# Patient Record
Sex: Male | Born: 1945 | Race: Black or African American | Hispanic: No | Marital: Married | Smoking: Current every day smoker
Health system: Southern US, Community
[De-identification: ages and names within clinical notes are randomized; demographics above are authoritative.]

## PROBLEM LIST (undated history)

## (undated) DIAGNOSIS — M1990 Unspecified osteoarthritis, unspecified site: Secondary | ICD-10-CM

## (undated) DIAGNOSIS — R51 Headache: Secondary | ICD-10-CM

## (undated) DIAGNOSIS — J45909 Unspecified asthma, uncomplicated: Secondary | ICD-10-CM

## (undated) DIAGNOSIS — I10 Essential (primary) hypertension: Secondary | ICD-10-CM

## (undated) HISTORY — PX: FINGER AMPUTATION: SHX636

---

## 1998-10-07 ENCOUNTER — Encounter: Payer: Self-pay | Admitting: Emergency Medicine

## 1998-10-07 ENCOUNTER — Emergency Department (HOSPITAL_COMMUNITY): Admission: EM | Admit: 1998-10-07 | Discharge: 1998-10-07 | Payer: Self-pay | Admitting: Emergency Medicine

## 2008-03-24 ENCOUNTER — Emergency Department (HOSPITAL_COMMUNITY): Admission: EM | Admit: 2008-03-24 | Discharge: 2008-03-24 | Payer: Self-pay | Admitting: Emergency Medicine

## 2010-01-27 ENCOUNTER — Emergency Department (HOSPITAL_COMMUNITY): Admission: EM | Admit: 2010-01-27 | Discharge: 2010-01-28 | Payer: Self-pay | Admitting: Emergency Medicine

## 2012-10-17 ENCOUNTER — Other Ambulatory Visit: Payer: Self-pay | Admitting: Family Medicine

## 2012-10-17 NOTE — Telephone Encounter (Signed)
Please pull paper chart.  

## 2012-10-21 NOTE — Telephone Encounter (Signed)
Chart pulled to PA pool at nurses station [phone]

## 2012-10-21 NOTE — Telephone Encounter (Signed)
Please pull paper chart.  

## 2012-10-22 NOTE — Telephone Encounter (Signed)
Chart pulled to PA pool at nurses station [id number] °

## 2013-11-26 ENCOUNTER — Ambulatory Visit
Admission: RE | Admit: 2013-11-26 | Discharge: 2013-11-26 | Disposition: A | Discharge status: Home / Self Care | Payer: 59 | Source: Ambulatory Visit | Attending: Internal Medicine | Admitting: Internal Medicine

## 2013-11-26 ENCOUNTER — Other Ambulatory Visit: Payer: Self-pay | Admitting: Internal Medicine

## 2013-11-26 DIAGNOSIS — F17200 Nicotine dependence, unspecified, uncomplicated: Secondary | ICD-10-CM

## 2014-01-12 ENCOUNTER — Other Ambulatory Visit: Payer: Self-pay | Admitting: Gastroenterology

## 2014-02-16 ENCOUNTER — Encounter (HOSPITAL_COMMUNITY): Payer: Self-pay | Admitting: Pharmacy Technician

## 2014-02-17 ENCOUNTER — Encounter (HOSPITAL_COMMUNITY): Payer: Self-pay | Admitting: Pharmacy Technician

## 2014-02-20 ENCOUNTER — Encounter (HOSPITAL_COMMUNITY): Payer: Self-pay | Admitting: *Deleted

## 2014-03-03 ENCOUNTER — Encounter (HOSPITAL_COMMUNITY): Payer: Self-pay | Admitting: *Deleted

## 2014-03-03 ENCOUNTER — Ambulatory Visit (HOSPITAL_COMMUNITY)
Admission: RE | Admit: 2014-03-03 | Discharge: 2014-03-03 | Disposition: A | Discharge status: Home / Self Care | Payer: 59 | Source: Ambulatory Visit | Attending: Gastroenterology | Admitting: Gastroenterology

## 2014-03-03 ENCOUNTER — Ambulatory Visit (HOSPITAL_COMMUNITY): Payer: 59 | Admitting: Anesthesiology

## 2014-03-03 ENCOUNTER — Encounter (HOSPITAL_COMMUNITY)
Admission: RE | Disposition: A | Discharge status: Home / Self Care | Payer: Self-pay | Source: Ambulatory Visit | Attending: Gastroenterology

## 2014-03-03 ENCOUNTER — Encounter (HOSPITAL_COMMUNITY): Payer: 59 | Admitting: Anesthesiology

## 2014-03-03 DIAGNOSIS — J45909 Unspecified asthma, uncomplicated: Secondary | ICD-10-CM | POA: Diagnosis not present

## 2014-03-03 DIAGNOSIS — F17200 Nicotine dependence, unspecified, uncomplicated: Secondary | ICD-10-CM | POA: Insufficient documentation

## 2014-03-03 DIAGNOSIS — I10 Essential (primary) hypertension: Secondary | ICD-10-CM | POA: Insufficient documentation

## 2014-03-03 DIAGNOSIS — E559 Vitamin D deficiency, unspecified: Secondary | ICD-10-CM | POA: Diagnosis not present

## 2014-03-03 DIAGNOSIS — Z1211 Encounter for screening for malignant neoplasm of colon: Secondary | ICD-10-CM | POA: Insufficient documentation

## 2014-03-03 DIAGNOSIS — D126 Benign neoplasm of colon, unspecified: Secondary | ICD-10-CM | POA: Diagnosis not present

## 2014-03-03 DIAGNOSIS — M5430 Sciatica, unspecified side: Secondary | ICD-10-CM | POA: Diagnosis not present

## 2014-03-03 DIAGNOSIS — M129 Arthropathy, unspecified: Secondary | ICD-10-CM | POA: Insufficient documentation

## 2014-03-03 HISTORY — DX: Unspecified asthma, uncomplicated: J45.909

## 2014-03-03 HISTORY — DX: Headache: R51

## 2014-03-03 HISTORY — DX: Essential (primary) hypertension: I10

## 2014-03-03 HISTORY — DX: Unspecified osteoarthritis, unspecified site: M19.90

## 2014-03-03 HISTORY — PX: COLONOSCOPY WITH PROPOFOL: SHX5780

## 2014-03-03 SURGERY — COLONOSCOPY WITH PROPOFOL
Anesthesia: Monitor Anesthesia Care

## 2014-03-03 MED ORDER — PROPOFOL 10 MG/ML IV BOLUS
INTRAVENOUS | Status: AC
  Filled 2014-03-03: qty 20

## 2014-03-03 MED ORDER — PROPOFOL INFUSION 10 MG/ML OPTIME
INTRAVENOUS | Status: DC | PRN
  Administered 2014-03-03: 140 ug/kg/min via INTRAVENOUS

## 2014-03-03 MED ORDER — LACTATED RINGERS IV SOLN
INTRAVENOUS | Status: DC
  Administered 2014-03-03: 1000 mL via INTRAVENOUS

## 2014-03-03 MED ORDER — PROPOFOL 10 MG/ML IV BOLUS
INTRAVENOUS | Status: DC | PRN
  Administered 2014-03-03 (×3): 20 mg via INTRAVENOUS
  Administered 2014-03-03: 30 mg via INTRAVENOUS
  Administered 2014-03-03: 20 mg via INTRAVENOUS
  Administered 2014-03-03: 30 mg via INTRAVENOUS

## 2014-03-03 MED ORDER — SODIUM CHLORIDE 0.9 % IV SOLN: INTRAVENOUS | Status: DC

## 2014-03-03 SURGICAL SUPPLY — 22 items

## 2014-03-03 NOTE — Op Note (Signed)
Procedure: Baseline screening colonoscopy  Endoscopist: Earle Gell  Premedication: Propofol administered by anesthesia  Procedure: The patient was placed in the left lateral decubitus position. Anal inspection and digital rectal exam were normal. The Pentax pediatric colonoscope was introduced into the rectum and advanced to the cecum. A normal-appearing appendiceal orifice was identified. A normal-appearing ileocecal valve was identified. Colonic preparation for the exam today was satisfactory.  Rectum. From the mid rectum, a 3 mm sessile polyp was removed with the cold snare. Retroflexed view of the distal rectum was normal.  Sigmoid colon and descending colon. Normal  Splenic flexure. Normal  Transverse colon. From the mid transverse colon a 5 mm sessile polyp was removed with the cold snare. From the distal transverse colon, a 6 mm pedunculated polyp was removed with the hot snare.  Hepatic flexure. Normal  Ascending colon. Normal  Cecum and ileocecal valve. Normal  Assessment: A 3 mm sessile polyp was removed from the rectum, a 5 mm sessile polyp was removed from the mid transverse colon, and a 6 mm pedunculated polyp was removed from the distal transverse colon. All polyps were submitted in one bottle for pathological evaluation  Recommendation: If the polyps returned neoplastic pathologically, the patient should undergo a surveillance colonoscopy in 5 years. If the polyps returned nonneoplastic pathologically, the patient should undergo a repeat screening colonoscopy in 10 years

## 2014-03-03 NOTE — Anesthesia Postprocedure Evaluation (Signed)
Anesthesia Post Note  Patient: George Parrish  Procedure(s) Performed: Procedure(s) (LRB): COLONOSCOPY WITH PROPOFOL (N/A)  Anesthesia type: MAC  Patient location: PACU  Post pain: Pain level controlled  Post assessment: Post-op Vital signs reviewed  Last Vitals: BP 146/85  Pulse 77  Temp(Src) 36.4 C (Oral)  Resp 13  Ht 5\' 9"  (1.753 m)  Wt 160 lb (72.576 kg)  BMI 23.62 kg/m2  SpO2 97%  Post vital signs: Reviewed  Level of consciousness: awake  Complications: No apparent anesthesia complications

## 2014-03-03 NOTE — H&P (Signed)
  Procedure: Baseline screening colonoscopy  History: The patient is a 68 year old male born 1946-01-10. He is scheduled to undergo his first screening colonoscopy with polypectomy to prevent colon cancer.  Medication allergies: None  Past medical history: Hypertension. Asthma. Chronic cigarette smoking. Sciatica. Arthralgias. Vitamin D deficiency  Family history: Negative for colon cancer  Exam: The patient is alert and lying comfortably on the endoscopy stretcher. Lungs are clear to auscultation. Cardiac exam reveals a regular rhythm. Abdomen is soft and nontender to palpation.  Plan: Proceed with baseline screening colonoscopy

## 2014-03-03 NOTE — Transfer of Care (Signed)
Immediate Anesthesia Transfer of Care Note  Patient: George Parrish  Procedure(s) Performed: Procedure(s): COLONOSCOPY WITH PROPOFOL (N/A)  Patient Location: PACU and Endoscopy Unit  Anesthesia Type:MAC  Level of Consciousness: awake, alert  and oriented  Airway & Oxygen Therapy: Patient Spontanous Breathing and Patient connected to face mask oxygen  Post-op Assessment: Report given to PACU RN, Post -op Vital signs reviewed and stable and Patient moving all extremities X 4  Post vital signs: Reviewed and stable  Complications: No apparent anesthesia complications

## 2014-03-03 NOTE — Anesthesia Preprocedure Evaluation (Signed)
Anesthesia Evaluation  Patient identified by MRN, date of birth, ID band Patient awake    Reviewed: Allergy & Precautions, H&P , NPO status , Patient's Chart, lab work & pertinent test results  Airway Mallampati: II TM Distance: >3 FB Neck ROM: Full    Dental no notable dental hx.    Pulmonary asthma , Current Smoker,  breath sounds clear to auscultation  Pulmonary exam normal       Cardiovascular hypertension, Pt. on medications Rhythm:Regular Rate:Normal     Neuro/Psych  Headaches, negative psych ROS   GI/Hepatic negative GI ROS, Neg liver ROS,   Endo/Other  negative endocrine ROS  Renal/GU negative Renal ROS  negative genitourinary   Musculoskeletal  (+) Arthritis -,   Abdominal   Peds negative pediatric ROS (+)  Hematology negative hematology ROS (+)   Anesthesia Other Findings   Reproductive/Obstetrics                           Anesthesia Physical Anesthesia Plan  ASA: II  Anesthesia Plan: MAC   Post-op Pain Management:    Induction: Intravenous  Airway Management Planned:   Additional Equipment:   Intra-op Plan:   Post-operative Plan:   Informed Consent: I have reviewed the patients History and Physical, chart, labs and discussed the procedure including the risks, benefits and alternatives for the proposed anesthesia with the patient or authorized representative who has indicated his/her understanding and acceptance.   Dental advisory given  Plan Discussed with: CRNA  Anesthesia Plan Comments:         Anesthesia Quick Evaluation

## 2014-03-03 NOTE — Discharge Instructions (Signed)
Colonoscopy, Care After °These instructions give you information on caring for yourself after your procedure. Your doctor may also give you more specific instructions. Call your doctor if you have any problems or questions after your procedure. °HOME CARE °· Do not drive for 24 hours. °· Do not sign important papers or use machinery for 24 hours. °· You may shower. °· You may go back to your usual activities, but go slower for the first 24 hours. °· Take rest breaks often during the first 24 hours. °· Walk around or use warm packs on your belly (abdomen) if you have belly cramping or gas. °· Drink enough fluids to keep your pee (urine) clear or pale yellow. °· Resume your normal diet. Avoid heavy or fried foods. °· Avoid drinking alcohol for 24 hours or as told by your doctor. °· Only take medicines as told by your doctor. °If a tissue sample (biopsy) was taken during the procedure:  °· Do not take aspirin or blood thinners for 7 days, or as told by your doctor. °· Do not drink alcohol for 7 days, or as told by your doctor. °· Eat soft foods for the first 24 hours. °GET HELP IF: °You still have a small amount of blood in your poop (stool) 2-3 days after the procedure. °GET HELP RIGHT AWAY IF: °· You have more than a small amount of blood in your poop. °· You see clumps of tissue (blood clots) in your poop. °· Your belly is puffy (swollen). °· You feel sick to your stomach (nauseous) or throw up (vomit). °· You have a fever. °· You have belly pain that gets worse and medicine does not help. °MAKE SURE YOU: °· Understand these instructions. °· Will watch your condition. °· Will get help right away if you are not doing well or get worse. °Document Released: 07/22/2010 Document Revised: 06/24/2013 Document Reviewed: 02/24/2013 °ExitCare® Patient Information ©2015 [employer]. This information is not intended to replace advice given to you by your health care provider. Make sure you discuss any questions you have with  your health care provider. ° °

## 2014-03-04 ENCOUNTER — Encounter (HOSPITAL_COMMUNITY): Payer: Self-pay | Admitting: Gastroenterology

## 2014-12-24 ENCOUNTER — Other Ambulatory Visit: Payer: Self-pay | Admitting: Acute Care

## 2014-12-24 ENCOUNTER — Telehealth: Payer: Self-pay | Admitting: Acute Care

## 2014-12-24 DIAGNOSIS — Z87891 Personal history of nicotine dependence: Secondary | ICD-10-CM

## 2014-12-24 NOTE — Telephone Encounter (Signed)
I called and scheduled George Parrish for a lung cancer screening counseling visit and LDCT. He is scheduled for July 5th at 2 pm for his [facility], and 3 pm for his CT at [facility].He verbalized understanding of both appointments and locations. He has my contact information should he have any further questions.

## 2015-01-05 ENCOUNTER — Ambulatory Visit (INDEPENDENT_AMBULATORY_CARE_PROVIDER_SITE_OTHER)
Admission: RE | Admit: 2015-01-05 | Discharge: 2015-01-05 | Disposition: A | Discharge status: Home / Self Care | Payer: Medicare Other | Source: Ambulatory Visit | Attending: Acute Care | Admitting: Acute Care

## 2015-01-05 ENCOUNTER — Telehealth: Payer: Self-pay | Admitting: Acute Care

## 2015-01-05 ENCOUNTER — Encounter: Payer: Self-pay | Admitting: Acute Care

## 2015-01-05 ENCOUNTER — Ambulatory Visit (INDEPENDENT_AMBULATORY_CARE_PROVIDER_SITE_OTHER): Payer: Medicare Other | Admitting: Acute Care

## 2015-01-05 DIAGNOSIS — Z87891 Personal history of nicotine dependence: Secondary | ICD-10-CM

## 2015-01-05 NOTE — Progress Notes (Signed)
Shared Decision Making Visit Lung Cancer Screening Program [phone])   Eligibility:  Age 69 y.o.  Pack Years Smoking History Calculation : 40 pack years (# packs/per year x # years smoked)  Recent History of coughing up blood  no  Unexplained weight loss? no ( >Than 15 pounds within the last 6 months )  Prior History Lung / other cancer no (Diagnosis within the last 5 years already requiring surveillance chest CT Scans).  Smoking Status Current Smoker  Former Smokers: Years since quit: NA  Quit Date: NA  Visit Components:  Discussion included one or more decision making aids. yes  Discussion included risk/benefits of screening. yes  Discussion included potential follow up diagnostic testing for abnormal scans. yes  Discussion included meaning and risk of over diagnosis. yes  Discussion included meaning and risk of False Positives. yes  Discussion included meaning of total radiation exposure. yes  Counseling Included:  Importance of adherence to annual lung cancer LDCT screening. yes  Impact of comorbidities on ability to participate in the program. yes  Ability and willingness to under diagnostic treatment. yes  Smoking Cessation Counseling:  Current Smokers:   Discussed importance of smoking cessation. yes  Information about tobacco cessation classes and interventions provided to patient. yes  Patient provided with "ticket" for LDCT Scan. yes  Symptomatic Patient. no  Counseling: NA  Diagnosis Code: Tobacco Use Z72.0  Asymptomatic Patient yes  Counseling (Intermediate counseling: > three minutes counseling) N8177  Former Smokers:   Discussed the importance of maintaining cigarette abstinence.NA  Diagnosis Code: Personal History of Nicotine Dependence. N16.579  Information about tobacco cessation classes and interventions provided to patient. Yes  Patient provided with "ticket" for LDCT Scan. yes  Written Order for Lung Cancer Screening with  LDCT placed in Epic. Yes (CT Chest Lung Cancer Screening Low Dose W/O CM) [id number] Z12.2-Screening of respiratory organs Z87.891-Personal history of nicotine dependence  I spent 20 minutes of face to face time with George Parrish discussing the risks and benefits of the Lung Cancer Screening Program. We reviewed a power point presentation together which highlighted the bullets noted above, allowing time to discuss each slide and ask and answer questions. We discussed the single most powerful action George Parrish can take to decrease his risk of lung cancer is to quit smoking. George Parrish is currently taking Chantix and has gone from 1 pack of cigarettes every day and a half to 1 pack every 3 days. He has noted he is unable to sleep at night since he has started the second box of reduced dose ( this was not a problem while on the first box). He told me that he had about a 3-4 week lapse between the boxes, and wondered if he needed to start on the full dose again. He is going to call Dr. Inda Merlin, who wrote the RX, to see if he will allow him to restart the cycle again on the full dose. I encouraged him and told him what a great accomplishment this was. I encouraged him to keep setting small goals until he was able to quit. He is motivated and really actively trying to quit.Take aways from today's visit include a copy of the power point we viewed together, my business card and contact information, the ticket to ride the scanner, and the " Be stronger than your excuses " card. George Parrish verbally acknowledged time and location of the scan. I called to let radiology know they patient was going to be early for  his scan as I saw him early. They verbalized that they would fit him in early as able. I told George Parrish that I would call with the results of the scan within the next day or 2 at the most. He verbalized understanding. Magdalen Spatz, NP

## 2015-01-05 NOTE — Telephone Encounter (Signed)
I called to give George Parrish to give him his scan results. There was no answer. I have left my contact information and asked that he call me back for the results. I will also fax the results to Dr. Mertha Finders as he is the patient's PCP and referred him for the screening.

## 2015-01-06 ENCOUNTER — Telehealth: Payer: Self-pay | Admitting: Acute Care

## 2015-01-06 NOTE — Telephone Encounter (Signed)
George Parrish returned my call for the results of his CT scan. I explained to him that his CT resulted as a Lung RADS 2, which indicated nodules with a very low likelihood of becoming a clinically active cancer due to size or lack of growth. He verbalized understanding. I explained that we would call him in about 11 months to schedule his repeat annual scan for July of 2017. He verbalized understanding. I encouraged him to call his PCP with any changes in his health, specifically weigh loss that he cannot explain, or coughing up blood. He verbalized understanding.We discussed his using Chantix and his goal to continue to decrease the number of cigarettes he smokes per day, and that if he needs any help in meeting that goal to call me or Dr. Inda Merlin for support. He agreed and verbalized understanding .George Parrish has my contact information should he have any further questions or concerns.

## 2015-03-04 ENCOUNTER — Encounter (INDEPENDENT_AMBULATORY_CARE_PROVIDER_SITE_OTHER): Payer: Medicare Other | Admitting: Ophthalmology

## 2015-03-04 DIAGNOSIS — H3531 Nonexudative age-related macular degeneration: Secondary | ICD-10-CM

## 2015-03-04 DIAGNOSIS — H43813 Vitreous degeneration, bilateral: Secondary | ICD-10-CM

## 2015-03-04 DIAGNOSIS — H3532 Exudative age-related macular degeneration: Secondary | ICD-10-CM | POA: Diagnosis not present

## 2015-03-04 DIAGNOSIS — I10 Essential (primary) hypertension: Secondary | ICD-10-CM | POA: Diagnosis not present

## 2015-03-04 DIAGNOSIS — H35033 Hypertensive retinopathy, bilateral: Secondary | ICD-10-CM

## 2015-03-29 ENCOUNTER — Encounter (INDEPENDENT_AMBULATORY_CARE_PROVIDER_SITE_OTHER): Payer: Medicare Other | Admitting: Ophthalmology

## 2015-03-29 DIAGNOSIS — H35033 Hypertensive retinopathy, bilateral: Secondary | ICD-10-CM | POA: Diagnosis not present

## 2015-03-29 DIAGNOSIS — H43813 Vitreous degeneration, bilateral: Secondary | ICD-10-CM

## 2015-03-29 DIAGNOSIS — H3532 Exudative age-related macular degeneration: Secondary | ICD-10-CM | POA: Diagnosis not present

## 2015-03-29 DIAGNOSIS — I10 Essential (primary) hypertension: Secondary | ICD-10-CM | POA: Diagnosis not present

## 2015-04-26 ENCOUNTER — Encounter (INDEPENDENT_AMBULATORY_CARE_PROVIDER_SITE_OTHER): Payer: Medicare Other | Admitting: Ophthalmology

## 2015-04-26 DIAGNOSIS — H43813 Vitreous degeneration, bilateral: Secondary | ICD-10-CM

## 2015-04-26 DIAGNOSIS — H35033 Hypertensive retinopathy, bilateral: Secondary | ICD-10-CM

## 2015-04-26 DIAGNOSIS — I10 Essential (primary) hypertension: Secondary | ICD-10-CM

## 2015-04-26 DIAGNOSIS — H353211 Exudative age-related macular degeneration, right eye, with active choroidal neovascularization: Secondary | ICD-10-CM | POA: Diagnosis not present

## 2015-04-26 DIAGNOSIS — H2513 Age-related nuclear cataract, bilateral: Secondary | ICD-10-CM | POA: Diagnosis not present

## 2015-05-24 ENCOUNTER — Encounter (INDEPENDENT_AMBULATORY_CARE_PROVIDER_SITE_OTHER): Payer: Medicare Other | Admitting: Ophthalmology

## 2015-05-24 DIAGNOSIS — I10 Essential (primary) hypertension: Secondary | ICD-10-CM | POA: Diagnosis not present

## 2015-05-24 DIAGNOSIS — H353122 Nonexudative age-related macular degeneration, left eye, intermediate dry stage: Secondary | ICD-10-CM

## 2015-05-24 DIAGNOSIS — H353231 Exudative age-related macular degeneration, bilateral, with active choroidal neovascularization: Secondary | ICD-10-CM

## 2015-05-24 DIAGNOSIS — H35033 Hypertensive retinopathy, bilateral: Secondary | ICD-10-CM | POA: Diagnosis not present

## 2015-06-21 ENCOUNTER — Encounter (INDEPENDENT_AMBULATORY_CARE_PROVIDER_SITE_OTHER): Payer: Medicare Other | Admitting: Ophthalmology

## 2015-06-21 DIAGNOSIS — H353211 Exudative age-related macular degeneration, right eye, with active choroidal neovascularization: Secondary | ICD-10-CM

## 2015-06-21 DIAGNOSIS — H43813 Vitreous degeneration, bilateral: Secondary | ICD-10-CM

## 2015-06-21 DIAGNOSIS — H353121 Nonexudative age-related macular degeneration, left eye, early dry stage: Secondary | ICD-10-CM

## 2015-06-21 DIAGNOSIS — H35033 Hypertensive retinopathy, bilateral: Secondary | ICD-10-CM

## 2015-06-21 DIAGNOSIS — I10 Essential (primary) hypertension: Secondary | ICD-10-CM

## 2015-07-19 ENCOUNTER — Encounter (INDEPENDENT_AMBULATORY_CARE_PROVIDER_SITE_OTHER): Payer: Medicare Other | Admitting: Ophthalmology

## 2015-07-19 DIAGNOSIS — H43813 Vitreous degeneration, bilateral: Secondary | ICD-10-CM | POA: Diagnosis not present

## 2015-07-19 DIAGNOSIS — H353121 Nonexudative age-related macular degeneration, left eye, early dry stage: Secondary | ICD-10-CM

## 2015-07-19 DIAGNOSIS — H2513 Age-related nuclear cataract, bilateral: Secondary | ICD-10-CM | POA: Diagnosis not present

## 2015-07-19 DIAGNOSIS — I10 Essential (primary) hypertension: Secondary | ICD-10-CM

## 2015-07-19 DIAGNOSIS — H35033 Hypertensive retinopathy, bilateral: Secondary | ICD-10-CM | POA: Diagnosis not present

## 2015-07-19 DIAGNOSIS — H353211 Exudative age-related macular degeneration, right eye, with active choroidal neovascularization: Secondary | ICD-10-CM

## 2015-08-16 ENCOUNTER — Encounter (INDEPENDENT_AMBULATORY_CARE_PROVIDER_SITE_OTHER): Payer: Medicare Other | Admitting: Ophthalmology

## 2015-08-16 DIAGNOSIS — H353211 Exudative age-related macular degeneration, right eye, with active choroidal neovascularization: Secondary | ICD-10-CM | POA: Diagnosis not present

## 2015-08-16 DIAGNOSIS — H43813 Vitreous degeneration, bilateral: Secondary | ICD-10-CM

## 2015-08-16 DIAGNOSIS — I10 Essential (primary) hypertension: Secondary | ICD-10-CM

## 2015-08-16 DIAGNOSIS — H35033 Hypertensive retinopathy, bilateral: Secondary | ICD-10-CM | POA: Diagnosis not present

## 2015-08-16 DIAGNOSIS — H353121 Nonexudative age-related macular degeneration, left eye, early dry stage: Secondary | ICD-10-CM | POA: Diagnosis not present

## 2015-08-16 DIAGNOSIS — H2513 Age-related nuclear cataract, bilateral: Secondary | ICD-10-CM | POA: Diagnosis not present

## 2015-09-13 ENCOUNTER — Encounter (INDEPENDENT_AMBULATORY_CARE_PROVIDER_SITE_OTHER): Payer: Medicare Other | Admitting: Ophthalmology

## 2015-09-13 DIAGNOSIS — H35033 Hypertensive retinopathy, bilateral: Secondary | ICD-10-CM | POA: Diagnosis not present

## 2015-09-13 DIAGNOSIS — H43813 Vitreous degeneration, bilateral: Secondary | ICD-10-CM | POA: Diagnosis not present

## 2015-09-13 DIAGNOSIS — H2513 Age-related nuclear cataract, bilateral: Secondary | ICD-10-CM

## 2015-09-13 DIAGNOSIS — I10 Essential (primary) hypertension: Secondary | ICD-10-CM | POA: Diagnosis not present

## 2015-09-13 DIAGNOSIS — H353121 Nonexudative age-related macular degeneration, left eye, early dry stage: Secondary | ICD-10-CM | POA: Diagnosis not present

## 2015-09-13 DIAGNOSIS — H353211 Exudative age-related macular degeneration, right eye, with active choroidal neovascularization: Secondary | ICD-10-CM

## 2015-09-21 ENCOUNTER — Other Ambulatory Visit: Payer: Self-pay | Admitting: Acute Care

## 2015-09-21 DIAGNOSIS — F17210 Nicotine dependence, cigarettes, uncomplicated: Principal | ICD-10-CM

## 2015-10-18 ENCOUNTER — Encounter (INDEPENDENT_AMBULATORY_CARE_PROVIDER_SITE_OTHER): Payer: Medicare Other | Admitting: Ophthalmology

## 2015-10-18 DIAGNOSIS — H43813 Vitreous degeneration, bilateral: Secondary | ICD-10-CM

## 2015-10-18 DIAGNOSIS — H2513 Age-related nuclear cataract, bilateral: Secondary | ICD-10-CM

## 2015-10-18 DIAGNOSIS — H353211 Exudative age-related macular degeneration, right eye, with active choroidal neovascularization: Secondary | ICD-10-CM | POA: Diagnosis not present

## 2015-10-18 DIAGNOSIS — H35033 Hypertensive retinopathy, bilateral: Secondary | ICD-10-CM | POA: Diagnosis not present

## 2015-10-18 DIAGNOSIS — H353121 Nonexudative age-related macular degeneration, left eye, early dry stage: Secondary | ICD-10-CM

## 2015-10-18 DIAGNOSIS — I10 Essential (primary) hypertension: Secondary | ICD-10-CM

## 2015-10-25 ENCOUNTER — Telehealth: Payer: Self-pay | Admitting: *Deleted

## 2015-10-25 NOTE — Telephone Encounter (Signed)
Oncology Nurse Navigator Documentation  Oncology Nurse Navigator Flowsheets 10/25/2015  Navigator Encounter Type Telephone  Telephone Outgoing Call  Barriers/Navigation Needs Education  Education Smoking cessation  Interventions Education Method  Acuity Level 1  Time Spent with Patient 15   I called today to check on Mr. Rigoli and his smoking cessation.  I left vm message to call if he needed assistance quitting.  I left my name and phone number to call.

## 2015-12-06 ENCOUNTER — Encounter (INDEPENDENT_AMBULATORY_CARE_PROVIDER_SITE_OTHER): Payer: Medicare Other | Admitting: Ophthalmology

## 2015-12-06 DIAGNOSIS — H353211 Exudative age-related macular degeneration, right eye, with active choroidal neovascularization: Secondary | ICD-10-CM | POA: Diagnosis not present

## 2015-12-06 DIAGNOSIS — H43813 Vitreous degeneration, bilateral: Secondary | ICD-10-CM

## 2015-12-06 DIAGNOSIS — I10 Essential (primary) hypertension: Secondary | ICD-10-CM | POA: Diagnosis not present

## 2015-12-06 DIAGNOSIS — H35033 Hypertensive retinopathy, bilateral: Secondary | ICD-10-CM

## 2015-12-06 DIAGNOSIS — H2513 Age-related nuclear cataract, bilateral: Secondary | ICD-10-CM

## 2015-12-06 DIAGNOSIS — H353121 Nonexudative age-related macular degeneration, left eye, early dry stage: Secondary | ICD-10-CM

## 2016-01-06 ENCOUNTER — Ambulatory Visit
Admission: RE | Admit: 2016-01-06 | Discharge: 2016-01-06 | Disposition: A | Discharge status: Home / Self Care | Payer: Medicare Other | Source: Ambulatory Visit | Attending: Acute Care | Admitting: Acute Care

## 2016-01-06 DIAGNOSIS — F17210 Nicotine dependence, cigarettes, uncomplicated: Secondary | ICD-10-CM | POA: Diagnosis not present

## 2016-01-10 ENCOUNTER — Encounter (INDEPENDENT_AMBULATORY_CARE_PROVIDER_SITE_OTHER): Payer: Medicare Other | Admitting: Ophthalmology

## 2016-01-10 DIAGNOSIS — I10 Essential (primary) hypertension: Secondary | ICD-10-CM | POA: Diagnosis not present

## 2016-01-10 DIAGNOSIS — H35033 Hypertensive retinopathy, bilateral: Secondary | ICD-10-CM | POA: Diagnosis not present

## 2016-01-10 DIAGNOSIS — H43813 Vitreous degeneration, bilateral: Secondary | ICD-10-CM | POA: Diagnosis not present

## 2016-01-10 DIAGNOSIS — H353211 Exudative age-related macular degeneration, right eye, with active choroidal neovascularization: Secondary | ICD-10-CM | POA: Diagnosis not present

## 2016-01-10 DIAGNOSIS — H353121 Nonexudative age-related macular degeneration, left eye, early dry stage: Secondary | ICD-10-CM

## 2016-01-10 DIAGNOSIS — H2513 Age-related nuclear cataract, bilateral: Secondary | ICD-10-CM | POA: Diagnosis not present

## 2016-02-07 ENCOUNTER — Encounter (INDEPENDENT_AMBULATORY_CARE_PROVIDER_SITE_OTHER): Payer: Medicare Other | Admitting: Ophthalmology

## 2016-02-18 ENCOUNTER — Encounter (INDEPENDENT_AMBULATORY_CARE_PROVIDER_SITE_OTHER): Payer: Medicare Other | Admitting: Ophthalmology

## 2016-02-18 DIAGNOSIS — H43813 Vitreous degeneration, bilateral: Secondary | ICD-10-CM | POA: Diagnosis not present

## 2016-02-18 DIAGNOSIS — H353121 Nonexudative age-related macular degeneration, left eye, early dry stage: Secondary | ICD-10-CM

## 2016-02-18 DIAGNOSIS — H2513 Age-related nuclear cataract, bilateral: Secondary | ICD-10-CM | POA: Diagnosis not present

## 2016-02-18 DIAGNOSIS — H353211 Exudative age-related macular degeneration, right eye, with active choroidal neovascularization: Secondary | ICD-10-CM | POA: Diagnosis not present

## 2016-02-18 DIAGNOSIS — H35033 Hypertensive retinopathy, bilateral: Secondary | ICD-10-CM

## 2016-02-18 DIAGNOSIS — I10 Essential (primary) hypertension: Secondary | ICD-10-CM | POA: Diagnosis not present

## 2016-04-07 ENCOUNTER — Encounter (INDEPENDENT_AMBULATORY_CARE_PROVIDER_SITE_OTHER): Payer: Medicare Other | Admitting: Ophthalmology

## 2016-04-07 DIAGNOSIS — H35033 Hypertensive retinopathy, bilateral: Secondary | ICD-10-CM | POA: Diagnosis not present

## 2016-04-07 DIAGNOSIS — H43813 Vitreous degeneration, bilateral: Secondary | ICD-10-CM | POA: Diagnosis not present

## 2016-04-07 DIAGNOSIS — I10 Essential (primary) hypertension: Secondary | ICD-10-CM | POA: Diagnosis not present

## 2016-04-07 DIAGNOSIS — H353211 Exudative age-related macular degeneration, right eye, with active choroidal neovascularization: Secondary | ICD-10-CM | POA: Diagnosis not present

## 2016-04-07 DIAGNOSIS — H353122 Nonexudative age-related macular degeneration, left eye, intermediate dry stage: Secondary | ICD-10-CM

## 2016-04-07 DIAGNOSIS — H2513 Age-related nuclear cataract, bilateral: Secondary | ICD-10-CM | POA: Diagnosis not present

## 2016-05-19 ENCOUNTER — Encounter (INDEPENDENT_AMBULATORY_CARE_PROVIDER_SITE_OTHER): Payer: Medicare Other | Admitting: Ophthalmology

## 2016-05-19 DIAGNOSIS — H353121 Nonexudative age-related macular degeneration, left eye, early dry stage: Secondary | ICD-10-CM

## 2016-05-19 DIAGNOSIS — I10 Essential (primary) hypertension: Secondary | ICD-10-CM | POA: Diagnosis not present

## 2016-05-19 DIAGNOSIS — H2513 Age-related nuclear cataract, bilateral: Secondary | ICD-10-CM

## 2016-05-19 DIAGNOSIS — H35033 Hypertensive retinopathy, bilateral: Secondary | ICD-10-CM | POA: Diagnosis not present

## 2016-05-19 DIAGNOSIS — H353211 Exudative age-related macular degeneration, right eye, with active choroidal neovascularization: Secondary | ICD-10-CM | POA: Diagnosis not present

## 2016-05-19 DIAGNOSIS — H43813 Vitreous degeneration, bilateral: Secondary | ICD-10-CM | POA: Diagnosis not present

## 2016-06-23 ENCOUNTER — Encounter (INDEPENDENT_AMBULATORY_CARE_PROVIDER_SITE_OTHER): Payer: Medicare Other | Admitting: Ophthalmology

## 2016-06-23 DIAGNOSIS — H2513 Age-related nuclear cataract, bilateral: Secondary | ICD-10-CM | POA: Diagnosis not present

## 2016-06-23 DIAGNOSIS — H43813 Vitreous degeneration, bilateral: Secondary | ICD-10-CM | POA: Diagnosis not present

## 2016-06-23 DIAGNOSIS — H353211 Exudative age-related macular degeneration, right eye, with active choroidal neovascularization: Secondary | ICD-10-CM

## 2016-06-23 DIAGNOSIS — I10 Essential (primary) hypertension: Secondary | ICD-10-CM | POA: Diagnosis not present

## 2016-06-23 DIAGNOSIS — H353121 Nonexudative age-related macular degeneration, left eye, early dry stage: Secondary | ICD-10-CM | POA: Diagnosis not present

## 2016-06-23 DIAGNOSIS — H35033 Hypertensive retinopathy, bilateral: Secondary | ICD-10-CM

## 2016-07-21 ENCOUNTER — Encounter (INDEPENDENT_AMBULATORY_CARE_PROVIDER_SITE_OTHER): Payer: Medicare Other | Admitting: Ophthalmology

## 2016-07-21 DIAGNOSIS — I10 Essential (primary) hypertension: Secondary | ICD-10-CM

## 2016-07-21 DIAGNOSIS — H353211 Exudative age-related macular degeneration, right eye, with active choroidal neovascularization: Secondary | ICD-10-CM | POA: Diagnosis not present

## 2016-07-21 DIAGNOSIS — H43813 Vitreous degeneration, bilateral: Secondary | ICD-10-CM

## 2016-07-21 DIAGNOSIS — H2513 Age-related nuclear cataract, bilateral: Secondary | ICD-10-CM | POA: Diagnosis not present

## 2016-07-21 DIAGNOSIS — H353121 Nonexudative age-related macular degeneration, left eye, early dry stage: Secondary | ICD-10-CM

## 2016-07-21 DIAGNOSIS — H35033 Hypertensive retinopathy, bilateral: Secondary | ICD-10-CM

## 2016-08-16 ENCOUNTER — Encounter (INDEPENDENT_AMBULATORY_CARE_PROVIDER_SITE_OTHER): Payer: Medicare Other | Admitting: Ophthalmology

## 2016-08-16 DIAGNOSIS — H353211 Exudative age-related macular degeneration, right eye, with active choroidal neovascularization: Secondary | ICD-10-CM

## 2016-08-16 DIAGNOSIS — H2513 Age-related nuclear cataract, bilateral: Secondary | ICD-10-CM

## 2016-08-16 DIAGNOSIS — H353121 Nonexudative age-related macular degeneration, left eye, early dry stage: Secondary | ICD-10-CM

## 2016-08-16 DIAGNOSIS — H35033 Hypertensive retinopathy, bilateral: Secondary | ICD-10-CM | POA: Diagnosis not present

## 2016-08-16 DIAGNOSIS — H43813 Vitreous degeneration, bilateral: Secondary | ICD-10-CM

## 2016-08-16 DIAGNOSIS — I10 Essential (primary) hypertension: Secondary | ICD-10-CM | POA: Diagnosis not present

## 2016-09-20 ENCOUNTER — Encounter (INDEPENDENT_AMBULATORY_CARE_PROVIDER_SITE_OTHER): Payer: Medicare Other | Admitting: Ophthalmology

## 2016-09-20 DIAGNOSIS — H353122 Nonexudative age-related macular degeneration, left eye, intermediate dry stage: Secondary | ICD-10-CM

## 2016-09-20 DIAGNOSIS — H353211 Exudative age-related macular degeneration, right eye, with active choroidal neovascularization: Secondary | ICD-10-CM

## 2016-09-20 DIAGNOSIS — H43813 Vitreous degeneration, bilateral: Secondary | ICD-10-CM | POA: Diagnosis not present

## 2016-09-20 DIAGNOSIS — I10 Essential (primary) hypertension: Secondary | ICD-10-CM

## 2016-09-20 DIAGNOSIS — H35033 Hypertensive retinopathy, bilateral: Secondary | ICD-10-CM | POA: Diagnosis not present

## 2016-10-25 ENCOUNTER — Encounter (INDEPENDENT_AMBULATORY_CARE_PROVIDER_SITE_OTHER): Payer: Medicare Other | Admitting: Ophthalmology

## 2016-10-25 DIAGNOSIS — H353211 Exudative age-related macular degeneration, right eye, with active choroidal neovascularization: Secondary | ICD-10-CM | POA: Diagnosis not present

## 2016-10-25 DIAGNOSIS — H35033 Hypertensive retinopathy, bilateral: Secondary | ICD-10-CM | POA: Diagnosis not present

## 2016-10-25 DIAGNOSIS — I10 Essential (primary) hypertension: Secondary | ICD-10-CM | POA: Diagnosis not present

## 2016-10-25 DIAGNOSIS — H353121 Nonexudative age-related macular degeneration, left eye, early dry stage: Secondary | ICD-10-CM | POA: Diagnosis not present

## 2016-10-25 DIAGNOSIS — H43813 Vitreous degeneration, bilateral: Secondary | ICD-10-CM | POA: Diagnosis not present

## 2016-11-29 ENCOUNTER — Encounter (INDEPENDENT_AMBULATORY_CARE_PROVIDER_SITE_OTHER): Payer: Medicare Other | Admitting: Ophthalmology

## 2016-11-29 DIAGNOSIS — H353211 Exudative age-related macular degeneration, right eye, with active choroidal neovascularization: Secondary | ICD-10-CM | POA: Diagnosis not present

## 2016-11-29 DIAGNOSIS — H43813 Vitreous degeneration, bilateral: Secondary | ICD-10-CM

## 2016-11-29 DIAGNOSIS — H353121 Nonexudative age-related macular degeneration, left eye, early dry stage: Secondary | ICD-10-CM

## 2016-11-29 DIAGNOSIS — H35033 Hypertensive retinopathy, bilateral: Secondary | ICD-10-CM | POA: Diagnosis not present

## 2016-11-29 DIAGNOSIS — I10 Essential (primary) hypertension: Secondary | ICD-10-CM

## 2016-12-15 ENCOUNTER — Other Ambulatory Visit: Payer: Self-pay | Admitting: Acute Care

## 2016-12-15 DIAGNOSIS — F17210 Nicotine dependence, cigarettes, uncomplicated: Secondary | ICD-10-CM

## 2017-01-01 ENCOUNTER — Encounter (INDEPENDENT_AMBULATORY_CARE_PROVIDER_SITE_OTHER): Payer: Medicare Other | Admitting: Ophthalmology

## 2017-01-01 DIAGNOSIS — H353122 Nonexudative age-related macular degeneration, left eye, intermediate dry stage: Secondary | ICD-10-CM | POA: Diagnosis not present

## 2017-01-01 DIAGNOSIS — H35033 Hypertensive retinopathy, bilateral: Secondary | ICD-10-CM

## 2017-01-01 DIAGNOSIS — H353211 Exudative age-related macular degeneration, right eye, with active choroidal neovascularization: Secondary | ICD-10-CM

## 2017-01-01 DIAGNOSIS — H43813 Vitreous degeneration, bilateral: Secondary | ICD-10-CM

## 2017-01-01 DIAGNOSIS — I10 Essential (primary) hypertension: Secondary | ICD-10-CM | POA: Diagnosis not present

## 2017-01-08 ENCOUNTER — Ambulatory Visit (INDEPENDENT_AMBULATORY_CARE_PROVIDER_SITE_OTHER)
Admission: RE | Admit: 2017-01-08 | Discharge: 2017-01-08 | Disposition: A | Discharge status: Home / Self Care | Payer: Medicare Other | Source: Ambulatory Visit | Attending: Acute Care | Admitting: Acute Care

## 2017-01-08 ENCOUNTER — Encounter (INDEPENDENT_AMBULATORY_CARE_PROVIDER_SITE_OTHER): Payer: Self-pay

## 2017-01-08 DIAGNOSIS — F17210 Nicotine dependence, cigarettes, uncomplicated: Secondary | ICD-10-CM

## 2017-01-08 DIAGNOSIS — Z87891 Personal history of nicotine dependence: Secondary | ICD-10-CM

## 2017-01-10 ENCOUNTER — Other Ambulatory Visit: Payer: Self-pay | Admitting: Acute Care

## 2017-01-10 DIAGNOSIS — F17210 Nicotine dependence, cigarettes, uncomplicated: Principal | ICD-10-CM

## 2017-02-05 ENCOUNTER — Encounter (INDEPENDENT_AMBULATORY_CARE_PROVIDER_SITE_OTHER): Payer: Medicare Other | Admitting: Ophthalmology

## 2017-02-05 DIAGNOSIS — H35033 Hypertensive retinopathy, bilateral: Secondary | ICD-10-CM | POA: Diagnosis not present

## 2017-02-05 DIAGNOSIS — H2512 Age-related nuclear cataract, left eye: Secondary | ICD-10-CM

## 2017-02-05 DIAGNOSIS — H353122 Nonexudative age-related macular degeneration, left eye, intermediate dry stage: Secondary | ICD-10-CM

## 2017-02-05 DIAGNOSIS — H43813 Vitreous degeneration, bilateral: Secondary | ICD-10-CM

## 2017-02-05 DIAGNOSIS — I10 Essential (primary) hypertension: Secondary | ICD-10-CM

## 2017-02-05 DIAGNOSIS — H353211 Exudative age-related macular degeneration, right eye, with active choroidal neovascularization: Secondary | ICD-10-CM | POA: Diagnosis not present

## 2017-03-06 ENCOUNTER — Encounter (INDEPENDENT_AMBULATORY_CARE_PROVIDER_SITE_OTHER): Payer: Medicare Other | Admitting: Ophthalmology

## 2017-03-06 DIAGNOSIS — H43813 Vitreous degeneration, bilateral: Secondary | ICD-10-CM

## 2017-03-06 DIAGNOSIS — H353121 Nonexudative age-related macular degeneration, left eye, early dry stage: Secondary | ICD-10-CM | POA: Diagnosis not present

## 2017-03-06 DIAGNOSIS — H353211 Exudative age-related macular degeneration, right eye, with active choroidal neovascularization: Secondary | ICD-10-CM | POA: Diagnosis not present

## 2017-03-06 DIAGNOSIS — I10 Essential (primary) hypertension: Secondary | ICD-10-CM

## 2017-03-06 DIAGNOSIS — H35033 Hypertensive retinopathy, bilateral: Secondary | ICD-10-CM

## 2017-04-02 ENCOUNTER — Encounter (INDEPENDENT_AMBULATORY_CARE_PROVIDER_SITE_OTHER): Payer: Medicare Other | Admitting: Ophthalmology

## 2017-04-02 DIAGNOSIS — H353211 Exudative age-related macular degeneration, right eye, with active choroidal neovascularization: Secondary | ICD-10-CM

## 2017-04-02 DIAGNOSIS — H43813 Vitreous degeneration, bilateral: Secondary | ICD-10-CM | POA: Diagnosis not present

## 2017-04-02 DIAGNOSIS — I10 Essential (primary) hypertension: Secondary | ICD-10-CM | POA: Diagnosis not present

## 2017-04-02 DIAGNOSIS — H2512 Age-related nuclear cataract, left eye: Secondary | ICD-10-CM

## 2017-04-02 DIAGNOSIS — H353121 Nonexudative age-related macular degeneration, left eye, early dry stage: Secondary | ICD-10-CM

## 2017-04-02 DIAGNOSIS — H35033 Hypertensive retinopathy, bilateral: Secondary | ICD-10-CM

## 2017-04-30 ENCOUNTER — Encounter (INDEPENDENT_AMBULATORY_CARE_PROVIDER_SITE_OTHER): Payer: Medicare Other | Admitting: Ophthalmology

## 2017-04-30 DIAGNOSIS — H2512 Age-related nuclear cataract, left eye: Secondary | ICD-10-CM | POA: Diagnosis not present

## 2017-04-30 DIAGNOSIS — H353211 Exudative age-related macular degeneration, right eye, with active choroidal neovascularization: Secondary | ICD-10-CM

## 2017-04-30 DIAGNOSIS — H35033 Hypertensive retinopathy, bilateral: Secondary | ICD-10-CM

## 2017-04-30 DIAGNOSIS — H43813 Vitreous degeneration, bilateral: Secondary | ICD-10-CM | POA: Diagnosis not present

## 2017-04-30 DIAGNOSIS — H353122 Nonexudative age-related macular degeneration, left eye, intermediate dry stage: Secondary | ICD-10-CM | POA: Diagnosis not present

## 2017-04-30 DIAGNOSIS — I10 Essential (primary) hypertension: Secondary | ICD-10-CM

## 2017-06-04 ENCOUNTER — Encounter (INDEPENDENT_AMBULATORY_CARE_PROVIDER_SITE_OTHER): Payer: Medicare Other | Admitting: Ophthalmology

## 2017-06-04 DIAGNOSIS — I10 Essential (primary) hypertension: Secondary | ICD-10-CM

## 2017-06-04 DIAGNOSIS — H35033 Hypertensive retinopathy, bilateral: Secondary | ICD-10-CM | POA: Diagnosis not present

## 2017-06-04 DIAGNOSIS — H43813 Vitreous degeneration, bilateral: Secondary | ICD-10-CM

## 2017-06-04 DIAGNOSIS — H2512 Age-related nuclear cataract, left eye: Secondary | ICD-10-CM | POA: Diagnosis not present

## 2017-06-04 DIAGNOSIS — H353211 Exudative age-related macular degeneration, right eye, with active choroidal neovascularization: Secondary | ICD-10-CM | POA: Diagnosis not present

## 2017-06-04 DIAGNOSIS — H353121 Nonexudative age-related macular degeneration, left eye, early dry stage: Secondary | ICD-10-CM

## 2017-07-16 ENCOUNTER — Encounter (INDEPENDENT_AMBULATORY_CARE_PROVIDER_SITE_OTHER): Payer: Medicare Other | Admitting: Ophthalmology

## 2017-07-16 DIAGNOSIS — H43813 Vitreous degeneration, bilateral: Secondary | ICD-10-CM | POA: Diagnosis not present

## 2017-07-16 DIAGNOSIS — H35033 Hypertensive retinopathy, bilateral: Secondary | ICD-10-CM

## 2017-07-16 DIAGNOSIS — H2512 Age-related nuclear cataract, left eye: Secondary | ICD-10-CM | POA: Diagnosis not present

## 2017-07-16 DIAGNOSIS — H353211 Exudative age-related macular degeneration, right eye, with active choroidal neovascularization: Secondary | ICD-10-CM

## 2017-07-16 DIAGNOSIS — H353121 Nonexudative age-related macular degeneration, left eye, early dry stage: Secondary | ICD-10-CM | POA: Diagnosis not present

## 2017-07-16 DIAGNOSIS — I10 Essential (primary) hypertension: Secondary | ICD-10-CM

## 2017-09-03 ENCOUNTER — Encounter (INDEPENDENT_AMBULATORY_CARE_PROVIDER_SITE_OTHER): Payer: Medicare Other | Admitting: Ophthalmology

## 2017-09-03 DIAGNOSIS — H353211 Exudative age-related macular degeneration, right eye, with active choroidal neovascularization: Secondary | ICD-10-CM | POA: Diagnosis not present

## 2017-09-03 DIAGNOSIS — H35033 Hypertensive retinopathy, bilateral: Secondary | ICD-10-CM | POA: Diagnosis not present

## 2017-09-03 DIAGNOSIS — H2522 Age-related cataract, morgagnian type, left eye: Secondary | ICD-10-CM | POA: Diagnosis not present

## 2017-09-03 DIAGNOSIS — H353122 Nonexudative age-related macular degeneration, left eye, intermediate dry stage: Secondary | ICD-10-CM | POA: Diagnosis not present

## 2017-09-03 DIAGNOSIS — H43813 Vitreous degeneration, bilateral: Secondary | ICD-10-CM

## 2017-09-03 DIAGNOSIS — I10 Essential (primary) hypertension: Secondary | ICD-10-CM

## 2017-09-11 ENCOUNTER — Other Ambulatory Visit (HOSPITAL_COMMUNITY): Payer: Self-pay | Admitting: Respiratory Therapy

## 2017-09-11 DIAGNOSIS — J441 Chronic obstructive pulmonary disease with (acute) exacerbation: Secondary | ICD-10-CM

## 2017-09-21 ENCOUNTER — Ambulatory Visit (HOSPITAL_COMMUNITY)
Admission: RE | Admit: 2017-09-21 | Discharge: 2017-09-21 | Disposition: A | Discharge status: Home / Self Care | Payer: Medicare Other | Source: Ambulatory Visit | Attending: Internal Medicine | Admitting: Internal Medicine

## 2017-09-21 DIAGNOSIS — F17210 Nicotine dependence, cigarettes, uncomplicated: Secondary | ICD-10-CM | POA: Insufficient documentation

## 2017-09-21 DIAGNOSIS — R942 Abnormal results of pulmonary function studies: Secondary | ICD-10-CM | POA: Diagnosis not present

## 2017-09-21 DIAGNOSIS — J441 Chronic obstructive pulmonary disease with (acute) exacerbation: Secondary | ICD-10-CM | POA: Diagnosis not present

## 2017-09-21 LAB — PULMONARY FUNCTION TEST
DL/VA % pred: 77 %
DL/VA: 3.52 ml/min/mmHg/L
DLCO UNC: 18.4 ml/min/mmHg
DLCO unc % pred: 59 %
FEF 25-75 Post: 0.5 L/sec
FEF 25-75 Pre: 0.31 L/sec
FEF2575-%Change-Post: 60 %
FEF2575-%PRED-POST: 20 %
FEF2575-%Pred-Pre: 12 %
FEV1-%CHANGE-POST: 12 %
FEV1-%PRED-PRE: 44 %
FEV1-%Pred-Post: 49 %
FEV1-POST: 1.38 L
FEV1-Pre: 1.23 L
FEV1FVC-%Change-Post: -6 %
FEV1FVC-%Pred-Pre: 66 %
FEV6-%Change-Post: 12 %
FEV6-%PRED-POST: 65 %
FEV6-%PRED-PRE: 58 %
FEV6-POST: 2.31 L
FEV6-PRE: 2.05 L
FEV6FVC-%CHANGE-POST: -3 %
FEV6FVC-%PRED-PRE: 87 %
FEV6FVC-%Pred-Post: 85 %
FVC-%CHANGE-POST: 19 %
FVC-%PRED-POST: 79 %
FVC-%Pred-Pre: 66 %
FVC-Post: 2.93 L
FVC-Pre: 2.46 L
POST FEV1/FVC RATIO: 47 %
PRE FEV6/FVC RATIO: 83 %
Post FEV6/FVC ratio: 81 %
Pre FEV1/FVC ratio: 50 %
RV % PRED: 146 %
RV: 3.51 L
TLC % pred: 95 %
TLC: 6.48 L

## 2017-09-21 MED ORDER — ALBUTEROL SULFATE (2.5 MG/3ML) 0.083% IN NEBU
2.5000 mg | INHALATION_SOLUTION | Freq: Once | RESPIRATORY_TRACT | Status: AC
  Administered 2017-09-21: 2.5 mg via RESPIRATORY_TRACT

## 2017-10-08 ENCOUNTER — Encounter (INDEPENDENT_AMBULATORY_CARE_PROVIDER_SITE_OTHER): Payer: Medicare Other | Admitting: Ophthalmology

## 2017-10-08 DIAGNOSIS — H2512 Age-related nuclear cataract, left eye: Secondary | ICD-10-CM

## 2017-10-08 DIAGNOSIS — H43813 Vitreous degeneration, bilateral: Secondary | ICD-10-CM | POA: Diagnosis not present

## 2017-10-08 DIAGNOSIS — H353211 Exudative age-related macular degeneration, right eye, with active choroidal neovascularization: Secondary | ICD-10-CM | POA: Diagnosis not present

## 2017-10-08 DIAGNOSIS — I10 Essential (primary) hypertension: Secondary | ICD-10-CM | POA: Diagnosis not present

## 2017-10-08 DIAGNOSIS — H35033 Hypertensive retinopathy, bilateral: Secondary | ICD-10-CM

## 2017-10-08 DIAGNOSIS — H353121 Nonexudative age-related macular degeneration, left eye, early dry stage: Secondary | ICD-10-CM | POA: Diagnosis not present

## 2017-11-15 ENCOUNTER — Encounter (INDEPENDENT_AMBULATORY_CARE_PROVIDER_SITE_OTHER): Payer: Medicare Other | Admitting: Ophthalmology

## 2017-11-15 DIAGNOSIS — H2512 Age-related nuclear cataract, left eye: Secondary | ICD-10-CM

## 2017-11-15 DIAGNOSIS — I10 Essential (primary) hypertension: Secondary | ICD-10-CM

## 2017-11-15 DIAGNOSIS — H353211 Exudative age-related macular degeneration, right eye, with active choroidal neovascularization: Secondary | ICD-10-CM

## 2017-11-15 DIAGNOSIS — H43813 Vitreous degeneration, bilateral: Secondary | ICD-10-CM | POA: Diagnosis not present

## 2017-11-15 DIAGNOSIS — H35033 Hypertensive retinopathy, bilateral: Secondary | ICD-10-CM

## 2017-11-15 DIAGNOSIS — H353112 Nonexudative age-related macular degeneration, right eye, intermediate dry stage: Secondary | ICD-10-CM | POA: Diagnosis not present

## 2017-12-27 ENCOUNTER — Encounter (INDEPENDENT_AMBULATORY_CARE_PROVIDER_SITE_OTHER): Payer: Medicare Other | Admitting: Ophthalmology

## 2017-12-27 DIAGNOSIS — H353121 Nonexudative age-related macular degeneration, left eye, early dry stage: Secondary | ICD-10-CM | POA: Diagnosis not present

## 2017-12-27 DIAGNOSIS — H35033 Hypertensive retinopathy, bilateral: Secondary | ICD-10-CM

## 2017-12-27 DIAGNOSIS — H2512 Age-related nuclear cataract, left eye: Secondary | ICD-10-CM

## 2017-12-27 DIAGNOSIS — I10 Essential (primary) hypertension: Secondary | ICD-10-CM

## 2017-12-27 DIAGNOSIS — H43813 Vitreous degeneration, bilateral: Secondary | ICD-10-CM | POA: Diagnosis not present

## 2017-12-27 DIAGNOSIS — H353211 Exudative age-related macular degeneration, right eye, with active choroidal neovascularization: Secondary | ICD-10-CM | POA: Diagnosis not present

## 2018-02-07 ENCOUNTER — Encounter (INDEPENDENT_AMBULATORY_CARE_PROVIDER_SITE_OTHER): Payer: Medicare Other | Admitting: Ophthalmology

## 2018-02-07 DIAGNOSIS — H35033 Hypertensive retinopathy, bilateral: Secondary | ICD-10-CM | POA: Diagnosis not present

## 2018-02-07 DIAGNOSIS — H353121 Nonexudative age-related macular degeneration, left eye, early dry stage: Secondary | ICD-10-CM

## 2018-02-07 DIAGNOSIS — H43813 Vitreous degeneration, bilateral: Secondary | ICD-10-CM

## 2018-02-07 DIAGNOSIS — H2512 Age-related nuclear cataract, left eye: Secondary | ICD-10-CM

## 2018-02-07 DIAGNOSIS — I10 Essential (primary) hypertension: Secondary | ICD-10-CM | POA: Diagnosis not present

## 2018-02-07 DIAGNOSIS — H353211 Exudative age-related macular degeneration, right eye, with active choroidal neovascularization: Secondary | ICD-10-CM

## 2018-03-21 ENCOUNTER — Encounter (INDEPENDENT_AMBULATORY_CARE_PROVIDER_SITE_OTHER): Payer: Medicare Other | Admitting: Ophthalmology

## 2018-03-21 DIAGNOSIS — H35033 Hypertensive retinopathy, bilateral: Secondary | ICD-10-CM | POA: Diagnosis not present

## 2018-03-21 DIAGNOSIS — H353121 Nonexudative age-related macular degeneration, left eye, early dry stage: Secondary | ICD-10-CM

## 2018-03-21 DIAGNOSIS — I10 Essential (primary) hypertension: Secondary | ICD-10-CM

## 2018-03-21 DIAGNOSIS — H43813 Vitreous degeneration, bilateral: Secondary | ICD-10-CM

## 2018-03-21 DIAGNOSIS — H353211 Exudative age-related macular degeneration, right eye, with active choroidal neovascularization: Secondary | ICD-10-CM | POA: Diagnosis not present

## 2018-05-02 ENCOUNTER — Encounter (INDEPENDENT_AMBULATORY_CARE_PROVIDER_SITE_OTHER): Payer: Medicare Other | Admitting: Ophthalmology

## 2018-05-02 DIAGNOSIS — I10 Essential (primary) hypertension: Secondary | ICD-10-CM

## 2018-05-02 DIAGNOSIS — H353211 Exudative age-related macular degeneration, right eye, with active choroidal neovascularization: Secondary | ICD-10-CM

## 2018-05-02 DIAGNOSIS — H35033 Hypertensive retinopathy, bilateral: Secondary | ICD-10-CM

## 2018-05-02 DIAGNOSIS — H353121 Nonexudative age-related macular degeneration, left eye, early dry stage: Secondary | ICD-10-CM | POA: Diagnosis not present

## 2018-05-02 DIAGNOSIS — H43813 Vitreous degeneration, bilateral: Secondary | ICD-10-CM

## 2018-06-13 ENCOUNTER — Encounter (INDEPENDENT_AMBULATORY_CARE_PROVIDER_SITE_OTHER): Payer: Medicare Other | Admitting: Ophthalmology

## 2018-06-13 DIAGNOSIS — H353211 Exudative age-related macular degeneration, right eye, with active choroidal neovascularization: Secondary | ICD-10-CM | POA: Diagnosis not present

## 2018-06-13 DIAGNOSIS — I10 Essential (primary) hypertension: Secondary | ICD-10-CM | POA: Diagnosis not present

## 2018-06-13 DIAGNOSIS — H353122 Nonexudative age-related macular degeneration, left eye, intermediate dry stage: Secondary | ICD-10-CM | POA: Diagnosis not present

## 2018-06-13 DIAGNOSIS — H35033 Hypertensive retinopathy, bilateral: Secondary | ICD-10-CM | POA: Diagnosis not present

## 2018-06-13 DIAGNOSIS — H43813 Vitreous degeneration, bilateral: Secondary | ICD-10-CM

## 2018-06-13 DIAGNOSIS — H2512 Age-related nuclear cataract, left eye: Secondary | ICD-10-CM

## 2018-08-01 ENCOUNTER — Encounter (INDEPENDENT_AMBULATORY_CARE_PROVIDER_SITE_OTHER): Payer: Medicare Other | Admitting: Ophthalmology

## 2018-08-01 DIAGNOSIS — H43813 Vitreous degeneration, bilateral: Secondary | ICD-10-CM

## 2018-08-01 DIAGNOSIS — I10 Essential (primary) hypertension: Secondary | ICD-10-CM

## 2018-08-01 DIAGNOSIS — H35033 Hypertensive retinopathy, bilateral: Secondary | ICD-10-CM | POA: Diagnosis not present

## 2018-08-01 DIAGNOSIS — H353211 Exudative age-related macular degeneration, right eye, with active choroidal neovascularization: Secondary | ICD-10-CM | POA: Diagnosis not present

## 2018-08-01 DIAGNOSIS — H2512 Age-related nuclear cataract, left eye: Secondary | ICD-10-CM

## 2018-08-01 DIAGNOSIS — H353121 Nonexudative age-related macular degeneration, left eye, early dry stage: Secondary | ICD-10-CM | POA: Diagnosis not present

## 2018-09-26 ENCOUNTER — Encounter (INDEPENDENT_AMBULATORY_CARE_PROVIDER_SITE_OTHER): Payer: Medicare Other | Admitting: Ophthalmology

## 2018-09-26 ENCOUNTER — Other Ambulatory Visit: Payer: Self-pay

## 2018-09-26 DIAGNOSIS — H35033 Hypertensive retinopathy, bilateral: Secondary | ICD-10-CM

## 2018-09-26 DIAGNOSIS — H43813 Vitreous degeneration, bilateral: Secondary | ICD-10-CM

## 2018-09-26 DIAGNOSIS — H353211 Exudative age-related macular degeneration, right eye, with active choroidal neovascularization: Secondary | ICD-10-CM

## 2018-09-26 DIAGNOSIS — H2512 Age-related nuclear cataract, left eye: Secondary | ICD-10-CM

## 2018-09-26 DIAGNOSIS — H353121 Nonexudative age-related macular degeneration, left eye, early dry stage: Secondary | ICD-10-CM | POA: Diagnosis not present

## 2018-09-26 DIAGNOSIS — I10 Essential (primary) hypertension: Secondary | ICD-10-CM

## 2018-11-21 ENCOUNTER — Encounter (INDEPENDENT_AMBULATORY_CARE_PROVIDER_SITE_OTHER): Payer: Medicare Other | Admitting: Ophthalmology

## 2018-11-21 ENCOUNTER — Other Ambulatory Visit: Payer: Self-pay

## 2018-11-21 DIAGNOSIS — H35033 Hypertensive retinopathy, bilateral: Secondary | ICD-10-CM

## 2018-11-21 DIAGNOSIS — H353121 Nonexudative age-related macular degeneration, left eye, early dry stage: Secondary | ICD-10-CM

## 2018-11-21 DIAGNOSIS — H353211 Exudative age-related macular degeneration, right eye, with active choroidal neovascularization: Secondary | ICD-10-CM

## 2018-11-21 DIAGNOSIS — I10 Essential (primary) hypertension: Secondary | ICD-10-CM | POA: Diagnosis not present

## 2018-11-21 DIAGNOSIS — H43813 Vitreous degeneration, bilateral: Secondary | ICD-10-CM

## 2019-01-09 ENCOUNTER — Encounter (INDEPENDENT_AMBULATORY_CARE_PROVIDER_SITE_OTHER): Payer: Medicare Other | Admitting: Ophthalmology

## 2019-01-09 ENCOUNTER — Other Ambulatory Visit: Payer: Self-pay

## 2019-01-09 DIAGNOSIS — H43813 Vitreous degeneration, bilateral: Secondary | ICD-10-CM

## 2019-01-09 DIAGNOSIS — I10 Essential (primary) hypertension: Secondary | ICD-10-CM

## 2019-01-09 DIAGNOSIS — H353112 Nonexudative age-related macular degeneration, right eye, intermediate dry stage: Secondary | ICD-10-CM | POA: Diagnosis not present

## 2019-01-09 DIAGNOSIS — H35033 Hypertensive retinopathy, bilateral: Secondary | ICD-10-CM

## 2019-01-09 DIAGNOSIS — H353211 Exudative age-related macular degeneration, right eye, with active choroidal neovascularization: Secondary | ICD-10-CM | POA: Diagnosis not present

## 2019-01-09 DIAGNOSIS — H2512 Age-related nuclear cataract, left eye: Secondary | ICD-10-CM

## 2019-02-28 ENCOUNTER — Other Ambulatory Visit: Payer: Self-pay

## 2019-02-28 ENCOUNTER — Encounter (INDEPENDENT_AMBULATORY_CARE_PROVIDER_SITE_OTHER): Payer: Medicare Other | Admitting: Ophthalmology

## 2019-02-28 DIAGNOSIS — H2512 Age-related nuclear cataract, left eye: Secondary | ICD-10-CM

## 2019-02-28 DIAGNOSIS — H353121 Nonexudative age-related macular degeneration, left eye, early dry stage: Secondary | ICD-10-CM | POA: Diagnosis not present

## 2019-02-28 DIAGNOSIS — H35033 Hypertensive retinopathy, bilateral: Secondary | ICD-10-CM

## 2019-02-28 DIAGNOSIS — I10 Essential (primary) hypertension: Secondary | ICD-10-CM

## 2019-02-28 DIAGNOSIS — H353211 Exudative age-related macular degeneration, right eye, with active choroidal neovascularization: Secondary | ICD-10-CM | POA: Diagnosis not present

## 2019-02-28 DIAGNOSIS — H43813 Vitreous degeneration, bilateral: Secondary | ICD-10-CM

## 2019-04-21 ENCOUNTER — Encounter (INDEPENDENT_AMBULATORY_CARE_PROVIDER_SITE_OTHER): Payer: Medicare Other | Admitting: Ophthalmology

## 2019-04-21 ENCOUNTER — Other Ambulatory Visit: Payer: Self-pay

## 2019-04-21 DIAGNOSIS — H353211 Exudative age-related macular degeneration, right eye, with active choroidal neovascularization: Secondary | ICD-10-CM | POA: Diagnosis not present

## 2019-04-21 DIAGNOSIS — H43813 Vitreous degeneration, bilateral: Secondary | ICD-10-CM

## 2019-04-21 DIAGNOSIS — H35033 Hypertensive retinopathy, bilateral: Secondary | ICD-10-CM | POA: Diagnosis not present

## 2019-04-21 DIAGNOSIS — I10 Essential (primary) hypertension: Secondary | ICD-10-CM

## 2019-04-21 DIAGNOSIS — H353121 Nonexudative age-related macular degeneration, left eye, early dry stage: Secondary | ICD-10-CM | POA: Diagnosis not present

## 2019-04-21 DIAGNOSIS — H2512 Age-related nuclear cataract, left eye: Secondary | ICD-10-CM

## 2019-06-02 ENCOUNTER — Encounter (INDEPENDENT_AMBULATORY_CARE_PROVIDER_SITE_OTHER): Payer: Medicare Other | Admitting: Ophthalmology

## 2019-06-02 ENCOUNTER — Other Ambulatory Visit: Payer: Self-pay

## 2019-06-02 DIAGNOSIS — H353122 Nonexudative age-related macular degeneration, left eye, intermediate dry stage: Secondary | ICD-10-CM | POA: Diagnosis not present

## 2019-06-02 DIAGNOSIS — I10 Essential (primary) hypertension: Secondary | ICD-10-CM

## 2019-06-02 DIAGNOSIS — H353211 Exudative age-related macular degeneration, right eye, with active choroidal neovascularization: Secondary | ICD-10-CM | POA: Diagnosis not present

## 2019-06-02 DIAGNOSIS — H35033 Hypertensive retinopathy, bilateral: Secondary | ICD-10-CM

## 2019-06-02 DIAGNOSIS — H43813 Vitreous degeneration, bilateral: Secondary | ICD-10-CM

## 2019-07-14 ENCOUNTER — Encounter (INDEPENDENT_AMBULATORY_CARE_PROVIDER_SITE_OTHER): Payer: Medicare Other | Admitting: Ophthalmology

## 2019-07-14 DIAGNOSIS — H353211 Exudative age-related macular degeneration, right eye, with active choroidal neovascularization: Secondary | ICD-10-CM | POA: Diagnosis not present

## 2019-07-14 DIAGNOSIS — I10 Essential (primary) hypertension: Secondary | ICD-10-CM

## 2019-07-14 DIAGNOSIS — H353122 Nonexudative age-related macular degeneration, left eye, intermediate dry stage: Secondary | ICD-10-CM | POA: Diagnosis not present

## 2019-07-14 DIAGNOSIS — H35033 Hypertensive retinopathy, bilateral: Secondary | ICD-10-CM | POA: Diagnosis not present

## 2019-07-14 DIAGNOSIS — H43813 Vitreous degeneration, bilateral: Secondary | ICD-10-CM

## 2019-08-25 ENCOUNTER — Encounter (INDEPENDENT_AMBULATORY_CARE_PROVIDER_SITE_OTHER): Payer: Medicare Other | Admitting: Ophthalmology

## 2019-08-25 DIAGNOSIS — H2512 Age-related nuclear cataract, left eye: Secondary | ICD-10-CM

## 2019-08-25 DIAGNOSIS — H353211 Exudative age-related macular degeneration, right eye, with active choroidal neovascularization: Secondary | ICD-10-CM

## 2019-08-25 DIAGNOSIS — H43813 Vitreous degeneration, bilateral: Secondary | ICD-10-CM

## 2019-08-25 DIAGNOSIS — H35033 Hypertensive retinopathy, bilateral: Secondary | ICD-10-CM | POA: Diagnosis not present

## 2019-08-25 DIAGNOSIS — H353122 Nonexudative age-related macular degeneration, left eye, intermediate dry stage: Secondary | ICD-10-CM

## 2019-08-25 DIAGNOSIS — I10 Essential (primary) hypertension: Secondary | ICD-10-CM | POA: Diagnosis not present

## 2019-09-22 ENCOUNTER — Encounter: Payer: Self-pay | Admitting: Acute Care

## 2019-10-01 ENCOUNTER — Other Ambulatory Visit: Payer: Self-pay | Admitting: *Deleted

## 2019-10-01 DIAGNOSIS — Z87891 Personal history of nicotine dependence: Secondary | ICD-10-CM

## 2019-10-01 DIAGNOSIS — F17210 Nicotine dependence, cigarettes, uncomplicated: Secondary | ICD-10-CM

## 2019-10-06 ENCOUNTER — Encounter (INDEPENDENT_AMBULATORY_CARE_PROVIDER_SITE_OTHER): Payer: Medicare Other | Admitting: Ophthalmology

## 2019-10-06 DIAGNOSIS — H35033 Hypertensive retinopathy, bilateral: Secondary | ICD-10-CM | POA: Diagnosis not present

## 2019-10-06 DIAGNOSIS — I10 Essential (primary) hypertension: Secondary | ICD-10-CM | POA: Diagnosis not present

## 2019-10-06 DIAGNOSIS — H353211 Exudative age-related macular degeneration, right eye, with active choroidal neovascularization: Secondary | ICD-10-CM | POA: Diagnosis not present

## 2019-10-06 DIAGNOSIS — H353121 Nonexudative age-related macular degeneration, left eye, early dry stage: Secondary | ICD-10-CM | POA: Diagnosis not present

## 2019-10-06 DIAGNOSIS — H43813 Vitreous degeneration, bilateral: Secondary | ICD-10-CM

## 2019-10-09 ENCOUNTER — Ambulatory Visit (INDEPENDENT_AMBULATORY_CARE_PROVIDER_SITE_OTHER)
Admission: RE | Admit: 2019-10-09 | Discharge: 2019-10-09 | Disposition: A | Discharge status: Home / Self Care | Payer: Medicare Other | Source: Ambulatory Visit | Attending: Acute Care | Admitting: Acute Care

## 2019-10-09 ENCOUNTER — Other Ambulatory Visit: Payer: Self-pay

## 2019-10-09 DIAGNOSIS — F17210 Nicotine dependence, cigarettes, uncomplicated: Secondary | ICD-10-CM | POA: Diagnosis not present

## 2019-10-09 DIAGNOSIS — Z87891 Personal history of nicotine dependence: Secondary | ICD-10-CM

## 2019-10-10 NOTE — Progress Notes (Signed)
Please call patient and let them  know their  low dose Ct was read as a Lung RADS 2: nodules that are benign in appearance and behavior with a very low likelihood of becoming a clinically active cancer due to size or lack of growth. Recommendation per radiology is for a repeat LDCT in 12 months. .Please let them  know we will order and schedule their  annual screening scan for 10/2020. Please let them  know there was notation of CAD on their  scan.  Please remind the patient  that this is a non-gated exam therefore degree or severity of disease  cannot be determined. Please have them  follow up with their PCP regarding potential risk factor modification, dietary therapy or pharmacologic therapy if clinically indicated. Pt.  is  not currently on statin therapy. Please place order for annual  screening scan for  10/2020 and fax results to PCP. Thanks so much.  Please let patient know there was notation of Hepatic steatosis. This  is a term that describes the build up of fat in the liver. It is normal to have small amounts of fat in your liver, but when the proportion of liver cells that contain fat exceeds more than 5% it is indicative of early stage fatty liver.Treatment often involves reducing risk factors through a diet and exercise plan. It is generally a benign condition, but in a small percentage of patients it does require follow up. Please have the patient follow up with PCP regarding potential risk factor modification, dietary therapy or pharmacologic therapy if clinically indicated. Thanks so much

## 2019-10-13 ENCOUNTER — Other Ambulatory Visit: Payer: Self-pay | Admitting: *Deleted

## 2019-10-13 DIAGNOSIS — F17210 Nicotine dependence, cigarettes, uncomplicated: Secondary | ICD-10-CM

## 2019-10-13 DIAGNOSIS — Z87891 Personal history of nicotine dependence: Secondary | ICD-10-CM

## 2019-11-17 ENCOUNTER — Encounter (INDEPENDENT_AMBULATORY_CARE_PROVIDER_SITE_OTHER): Payer: Medicare Other | Admitting: Ophthalmology

## 2019-11-17 ENCOUNTER — Other Ambulatory Visit: Payer: Self-pay

## 2019-11-17 DIAGNOSIS — H353211 Exudative age-related macular degeneration, right eye, with active choroidal neovascularization: Secondary | ICD-10-CM | POA: Diagnosis not present

## 2019-11-17 DIAGNOSIS — H43813 Vitreous degeneration, bilateral: Secondary | ICD-10-CM

## 2019-11-17 DIAGNOSIS — H353121 Nonexudative age-related macular degeneration, left eye, early dry stage: Secondary | ICD-10-CM

## 2019-11-17 DIAGNOSIS — H35033 Hypertensive retinopathy, bilateral: Secondary | ICD-10-CM

## 2019-11-17 DIAGNOSIS — I10 Essential (primary) hypertension: Secondary | ICD-10-CM | POA: Diagnosis not present

## 2019-12-29 ENCOUNTER — Encounter (INDEPENDENT_AMBULATORY_CARE_PROVIDER_SITE_OTHER): Payer: Medicare Other | Admitting: Ophthalmology

## 2019-12-29 ENCOUNTER — Other Ambulatory Visit: Payer: Self-pay

## 2019-12-29 DIAGNOSIS — I10 Essential (primary) hypertension: Secondary | ICD-10-CM

## 2019-12-29 DIAGNOSIS — H43813 Vitreous degeneration, bilateral: Secondary | ICD-10-CM

## 2019-12-29 DIAGNOSIS — H353211 Exudative age-related macular degeneration, right eye, with active choroidal neovascularization: Secondary | ICD-10-CM | POA: Diagnosis not present

## 2019-12-29 DIAGNOSIS — H353121 Nonexudative age-related macular degeneration, left eye, early dry stage: Secondary | ICD-10-CM | POA: Diagnosis not present

## 2019-12-29 DIAGNOSIS — H35033 Hypertensive retinopathy, bilateral: Secondary | ICD-10-CM | POA: Diagnosis not present

## 2020-02-09 ENCOUNTER — Other Ambulatory Visit: Payer: Self-pay

## 2020-02-09 ENCOUNTER — Encounter (INDEPENDENT_AMBULATORY_CARE_PROVIDER_SITE_OTHER): Payer: Medicare Other | Admitting: Ophthalmology

## 2020-02-09 DIAGNOSIS — H353121 Nonexudative age-related macular degeneration, left eye, early dry stage: Secondary | ICD-10-CM | POA: Diagnosis not present

## 2020-02-09 DIAGNOSIS — I10 Essential (primary) hypertension: Secondary | ICD-10-CM

## 2020-02-09 DIAGNOSIS — H353211 Exudative age-related macular degeneration, right eye, with active choroidal neovascularization: Secondary | ICD-10-CM | POA: Diagnosis not present

## 2020-02-09 DIAGNOSIS — H35033 Hypertensive retinopathy, bilateral: Secondary | ICD-10-CM

## 2020-02-09 DIAGNOSIS — H43813 Vitreous degeneration, bilateral: Secondary | ICD-10-CM

## 2020-03-29 ENCOUNTER — Other Ambulatory Visit: Payer: Self-pay

## 2020-03-29 ENCOUNTER — Encounter (INDEPENDENT_AMBULATORY_CARE_PROVIDER_SITE_OTHER): Payer: Medicare Other | Admitting: Ophthalmology

## 2020-03-29 DIAGNOSIS — H43813 Vitreous degeneration, bilateral: Secondary | ICD-10-CM

## 2020-03-29 DIAGNOSIS — H35033 Hypertensive retinopathy, bilateral: Secondary | ICD-10-CM | POA: Diagnosis not present

## 2020-03-29 DIAGNOSIS — H353211 Exudative age-related macular degeneration, right eye, with active choroidal neovascularization: Secondary | ICD-10-CM

## 2020-03-29 DIAGNOSIS — I10 Essential (primary) hypertension: Secondary | ICD-10-CM

## 2020-03-29 DIAGNOSIS — H353121 Nonexudative age-related macular degeneration, left eye, early dry stage: Secondary | ICD-10-CM

## 2020-05-17 ENCOUNTER — Encounter (INDEPENDENT_AMBULATORY_CARE_PROVIDER_SITE_OTHER): Payer: Medicare Other | Admitting: Ophthalmology

## 2020-05-17 ENCOUNTER — Other Ambulatory Visit: Payer: Self-pay

## 2020-05-17 DIAGNOSIS — I10 Essential (primary) hypertension: Secondary | ICD-10-CM

## 2020-05-17 DIAGNOSIS — H35711 Central serous chorioretinopathy, right eye: Secondary | ICD-10-CM | POA: Diagnosis not present

## 2020-05-17 DIAGNOSIS — H43813 Vitreous degeneration, bilateral: Secondary | ICD-10-CM

## 2020-05-17 DIAGNOSIS — H35033 Hypertensive retinopathy, bilateral: Secondary | ICD-10-CM

## 2020-05-17 DIAGNOSIS — H353122 Nonexudative age-related macular degeneration, left eye, intermediate dry stage: Secondary | ICD-10-CM | POA: Diagnosis not present

## 2020-05-17 DIAGNOSIS — H353211 Exudative age-related macular degeneration, right eye, with active choroidal neovascularization: Secondary | ICD-10-CM

## 2020-07-19 ENCOUNTER — Encounter (INDEPENDENT_AMBULATORY_CARE_PROVIDER_SITE_OTHER): Payer: Medicare Other | Admitting: Ophthalmology

## 2020-07-20 ENCOUNTER — Encounter (INDEPENDENT_AMBULATORY_CARE_PROVIDER_SITE_OTHER): Payer: Medicare Other | Admitting: Ophthalmology

## 2020-07-22 ENCOUNTER — Encounter (INDEPENDENT_AMBULATORY_CARE_PROVIDER_SITE_OTHER): Payer: Medicare Other | Admitting: Ophthalmology

## 2020-07-22 ENCOUNTER — Other Ambulatory Visit: Payer: Self-pay

## 2020-07-22 DIAGNOSIS — H35033 Hypertensive retinopathy, bilateral: Secondary | ICD-10-CM

## 2020-07-22 DIAGNOSIS — I10 Essential (primary) hypertension: Secondary | ICD-10-CM | POA: Diagnosis not present

## 2020-07-22 DIAGNOSIS — H43813 Vitreous degeneration, bilateral: Secondary | ICD-10-CM

## 2020-07-22 DIAGNOSIS — H353211 Exudative age-related macular degeneration, right eye, with active choroidal neovascularization: Secondary | ICD-10-CM

## 2020-07-22 DIAGNOSIS — H35711 Central serous chorioretinopathy, right eye: Secondary | ICD-10-CM | POA: Diagnosis not present

## 2020-07-22 DIAGNOSIS — H353121 Nonexudative age-related macular degeneration, left eye, early dry stage: Secondary | ICD-10-CM

## 2020-07-29 DIAGNOSIS — J45901 Unspecified asthma with (acute) exacerbation: Secondary | ICD-10-CM | POA: Diagnosis not present

## 2020-07-29 DIAGNOSIS — J449 Chronic obstructive pulmonary disease, unspecified: Secondary | ICD-10-CM | POA: Diagnosis not present

## 2020-07-29 DIAGNOSIS — M19032 Primary osteoarthritis, left wrist: Secondary | ICD-10-CM | POA: Diagnosis not present

## 2020-07-29 DIAGNOSIS — J4520 Mild intermittent asthma, uncomplicated: Secondary | ICD-10-CM | POA: Diagnosis not present

## 2020-07-29 DIAGNOSIS — I10 Essential (primary) hypertension: Secondary | ICD-10-CM | POA: Diagnosis not present

## 2020-07-29 DIAGNOSIS — N183 Chronic kidney disease, stage 3 unspecified: Secondary | ICD-10-CM | POA: Diagnosis not present

## 2020-09-20 ENCOUNTER — Encounter (INDEPENDENT_AMBULATORY_CARE_PROVIDER_SITE_OTHER): Payer: Medicare Other | Admitting: Ophthalmology

## 2020-09-20 ENCOUNTER — Other Ambulatory Visit: Payer: Self-pay

## 2020-09-20 DIAGNOSIS — I10 Essential (primary) hypertension: Secondary | ICD-10-CM

## 2020-09-20 DIAGNOSIS — H43813 Vitreous degeneration, bilateral: Secondary | ICD-10-CM

## 2020-09-20 DIAGNOSIS — H35711 Central serous chorioretinopathy, right eye: Secondary | ICD-10-CM | POA: Diagnosis not present

## 2020-09-20 DIAGNOSIS — H35033 Hypertensive retinopathy, bilateral: Secondary | ICD-10-CM

## 2020-09-20 DIAGNOSIS — H353211 Exudative age-related macular degeneration, right eye, with active choroidal neovascularization: Secondary | ICD-10-CM

## 2020-09-20 DIAGNOSIS — H353121 Nonexudative age-related macular degeneration, left eye, early dry stage: Secondary | ICD-10-CM | POA: Diagnosis not present

## 2020-09-28 DIAGNOSIS — N183 Chronic kidney disease, stage 3 unspecified: Secondary | ICD-10-CM | POA: Diagnosis not present

## 2020-09-28 DIAGNOSIS — M19032 Primary osteoarthritis, left wrist: Secondary | ICD-10-CM | POA: Diagnosis not present

## 2020-09-28 DIAGNOSIS — J449 Chronic obstructive pulmonary disease, unspecified: Secondary | ICD-10-CM | POA: Diagnosis not present

## 2020-09-28 DIAGNOSIS — J4520 Mild intermittent asthma, uncomplicated: Secondary | ICD-10-CM | POA: Diagnosis not present

## 2020-09-28 DIAGNOSIS — I10 Essential (primary) hypertension: Secondary | ICD-10-CM | POA: Diagnosis not present

## 2020-09-28 DIAGNOSIS — J45901 Unspecified asthma with (acute) exacerbation: Secondary | ICD-10-CM | POA: Diagnosis not present

## 2020-10-08 ENCOUNTER — Inpatient Hospital Stay: Admission: RE | Admit: 2020-10-08 | Payer: Medicare Other | Source: Ambulatory Visit

## 2020-10-26 DIAGNOSIS — J449 Chronic obstructive pulmonary disease, unspecified: Secondary | ICD-10-CM | POA: Diagnosis not present

## 2020-10-26 DIAGNOSIS — N183 Chronic kidney disease, stage 3 unspecified: Secondary | ICD-10-CM | POA: Diagnosis not present

## 2020-10-26 DIAGNOSIS — I10 Essential (primary) hypertension: Secondary | ICD-10-CM | POA: Diagnosis not present

## 2020-10-26 DIAGNOSIS — J45901 Unspecified asthma with (acute) exacerbation: Secondary | ICD-10-CM | POA: Diagnosis not present

## 2020-10-26 DIAGNOSIS — J4520 Mild intermittent asthma, uncomplicated: Secondary | ICD-10-CM | POA: Diagnosis not present

## 2020-10-26 DIAGNOSIS — M19032 Primary osteoarthritis, left wrist: Secondary | ICD-10-CM | POA: Diagnosis not present

## 2020-11-15 ENCOUNTER — Other Ambulatory Visit: Payer: Self-pay

## 2020-11-15 ENCOUNTER — Encounter (INDEPENDENT_AMBULATORY_CARE_PROVIDER_SITE_OTHER): Payer: Medicare Other | Admitting: Ophthalmology

## 2020-11-15 DIAGNOSIS — H353211 Exudative age-related macular degeneration, right eye, with active choroidal neovascularization: Secondary | ICD-10-CM

## 2020-11-15 DIAGNOSIS — H353121 Nonexudative age-related macular degeneration, left eye, early dry stage: Secondary | ICD-10-CM | POA: Diagnosis not present

## 2020-11-15 DIAGNOSIS — H43813 Vitreous degeneration, bilateral: Secondary | ICD-10-CM

## 2020-11-15 DIAGNOSIS — H35711 Central serous chorioretinopathy, right eye: Secondary | ICD-10-CM | POA: Diagnosis not present

## 2020-11-15 DIAGNOSIS — I10 Essential (primary) hypertension: Secondary | ICD-10-CM

## 2020-11-15 DIAGNOSIS — H35033 Hypertensive retinopathy, bilateral: Secondary | ICD-10-CM | POA: Diagnosis not present

## 2020-11-25 ENCOUNTER — Other Ambulatory Visit: Payer: Self-pay | Admitting: Internal Medicine

## 2020-11-25 DIAGNOSIS — J439 Emphysema, unspecified: Secondary | ICD-10-CM

## 2020-12-24 DIAGNOSIS — I10 Essential (primary) hypertension: Secondary | ICD-10-CM | POA: Diagnosis not present

## 2020-12-24 DIAGNOSIS — N183 Chronic kidney disease, stage 3 unspecified: Secondary | ICD-10-CM | POA: Diagnosis not present

## 2020-12-24 DIAGNOSIS — J449 Chronic obstructive pulmonary disease, unspecified: Secondary | ICD-10-CM | POA: Diagnosis not present

## 2020-12-24 DIAGNOSIS — M19032 Primary osteoarthritis, left wrist: Secondary | ICD-10-CM | POA: Diagnosis not present

## 2020-12-24 DIAGNOSIS — J4520 Mild intermittent asthma, uncomplicated: Secondary | ICD-10-CM | POA: Diagnosis not present

## 2020-12-24 DIAGNOSIS — J45901 Unspecified asthma with (acute) exacerbation: Secondary | ICD-10-CM | POA: Diagnosis not present

## 2021-01-17 ENCOUNTER — Encounter (INDEPENDENT_AMBULATORY_CARE_PROVIDER_SITE_OTHER): Payer: Medicare Other | Admitting: Ophthalmology

## 2021-01-17 ENCOUNTER — Other Ambulatory Visit: Payer: Self-pay

## 2021-01-17 DIAGNOSIS — I10 Essential (primary) hypertension: Secondary | ICD-10-CM | POA: Diagnosis not present

## 2021-01-17 DIAGNOSIS — H353121 Nonexudative age-related macular degeneration, left eye, early dry stage: Secondary | ICD-10-CM

## 2021-01-17 DIAGNOSIS — H43813 Vitreous degeneration, bilateral: Secondary | ICD-10-CM | POA: Diagnosis not present

## 2021-01-17 DIAGNOSIS — H353211 Exudative age-related macular degeneration, right eye, with active choroidal neovascularization: Secondary | ICD-10-CM | POA: Diagnosis not present

## 2021-01-17 DIAGNOSIS — H35711 Central serous chorioretinopathy, right eye: Secondary | ICD-10-CM | POA: Diagnosis not present

## 2021-01-17 DIAGNOSIS — H35033 Hypertensive retinopathy, bilateral: Secondary | ICD-10-CM

## 2021-02-16 DIAGNOSIS — J45901 Unspecified asthma with (acute) exacerbation: Secondary | ICD-10-CM | POA: Diagnosis not present

## 2021-02-16 DIAGNOSIS — J4520 Mild intermittent asthma, uncomplicated: Secondary | ICD-10-CM | POA: Diagnosis not present

## 2021-02-16 DIAGNOSIS — N183 Chronic kidney disease, stage 3 unspecified: Secondary | ICD-10-CM | POA: Diagnosis not present

## 2021-02-16 DIAGNOSIS — J449 Chronic obstructive pulmonary disease, unspecified: Secondary | ICD-10-CM | POA: Diagnosis not present

## 2021-02-16 DIAGNOSIS — M19032 Primary osteoarthritis, left wrist: Secondary | ICD-10-CM | POA: Diagnosis not present

## 2021-02-16 DIAGNOSIS — I10 Essential (primary) hypertension: Secondary | ICD-10-CM | POA: Diagnosis not present

## 2021-02-28 ENCOUNTER — Encounter (INDEPENDENT_AMBULATORY_CARE_PROVIDER_SITE_OTHER): Payer: Medicare Other | Admitting: Ophthalmology

## 2021-02-28 ENCOUNTER — Other Ambulatory Visit: Payer: Self-pay

## 2021-02-28 DIAGNOSIS — I10 Essential (primary) hypertension: Secondary | ICD-10-CM | POA: Diagnosis not present

## 2021-02-28 DIAGNOSIS — H35711 Central serous chorioretinopathy, right eye: Secondary | ICD-10-CM | POA: Diagnosis not present

## 2021-02-28 DIAGNOSIS — H353121 Nonexudative age-related macular degeneration, left eye, early dry stage: Secondary | ICD-10-CM | POA: Diagnosis not present

## 2021-02-28 DIAGNOSIS — H43813 Vitreous degeneration, bilateral: Secondary | ICD-10-CM | POA: Diagnosis not present

## 2021-02-28 DIAGNOSIS — H353211 Exudative age-related macular degeneration, right eye, with active choroidal neovascularization: Secondary | ICD-10-CM

## 2021-02-28 DIAGNOSIS — H35033 Hypertensive retinopathy, bilateral: Secondary | ICD-10-CM

## 2021-04-11 ENCOUNTER — Other Ambulatory Visit: Payer: Self-pay

## 2021-04-11 ENCOUNTER — Encounter (INDEPENDENT_AMBULATORY_CARE_PROVIDER_SITE_OTHER): Payer: Medicare Other | Admitting: Ophthalmology

## 2021-04-11 DIAGNOSIS — H35711 Central serous chorioretinopathy, right eye: Secondary | ICD-10-CM

## 2021-04-11 DIAGNOSIS — I10 Essential (primary) hypertension: Secondary | ICD-10-CM | POA: Diagnosis not present

## 2021-04-11 DIAGNOSIS — H353211 Exudative age-related macular degeneration, right eye, with active choroidal neovascularization: Secondary | ICD-10-CM | POA: Diagnosis not present

## 2021-04-11 DIAGNOSIS — E113293 Type 2 diabetes mellitus with mild nonproliferative diabetic retinopathy without macular edema, bilateral: Secondary | ICD-10-CM | POA: Diagnosis not present

## 2021-04-11 DIAGNOSIS — H35033 Hypertensive retinopathy, bilateral: Secondary | ICD-10-CM | POA: Diagnosis not present

## 2021-04-11 DIAGNOSIS — H353122 Nonexudative age-related macular degeneration, left eye, intermediate dry stage: Secondary | ICD-10-CM

## 2021-04-27 ENCOUNTER — Other Ambulatory Visit: Payer: Self-pay | Admitting: Internal Medicine

## 2021-04-27 DIAGNOSIS — Z122 Encounter for screening for malignant neoplasm of respiratory organs: Secondary | ICD-10-CM

## 2021-04-27 DIAGNOSIS — F17210 Nicotine dependence, cigarettes, uncomplicated: Secondary | ICD-10-CM | POA: Diagnosis not present

## 2021-04-27 DIAGNOSIS — E559 Vitamin D deficiency, unspecified: Secondary | ICD-10-CM | POA: Diagnosis not present

## 2021-04-27 DIAGNOSIS — D649 Anemia, unspecified: Secondary | ICD-10-CM | POA: Diagnosis not present

## 2021-04-27 DIAGNOSIS — Z1389 Encounter for screening for other disorder: Secondary | ICD-10-CM | POA: Diagnosis not present

## 2021-04-27 DIAGNOSIS — Z1211 Encounter for screening for malignant neoplasm of colon: Secondary | ICD-10-CM | POA: Diagnosis not present

## 2021-04-27 DIAGNOSIS — J4520 Mild intermittent asthma, uncomplicated: Secondary | ICD-10-CM | POA: Diagnosis not present

## 2021-04-27 DIAGNOSIS — J449 Chronic obstructive pulmonary disease, unspecified: Secondary | ICD-10-CM | POA: Diagnosis not present

## 2021-04-27 DIAGNOSIS — Z0001 Encounter for general adult medical examination with abnormal findings: Secondary | ICD-10-CM | POA: Diagnosis not present

## 2021-04-27 DIAGNOSIS — N183 Chronic kidney disease, stage 3 unspecified: Secondary | ICD-10-CM | POA: Diagnosis not present

## 2021-04-27 DIAGNOSIS — I10 Essential (primary) hypertension: Secondary | ICD-10-CM | POA: Diagnosis not present

## 2021-04-27 DIAGNOSIS — Z0000 Encounter for general adult medical examination without abnormal findings: Secondary | ICD-10-CM | POA: Diagnosis not present

## 2021-04-27 DIAGNOSIS — F17200 Nicotine dependence, unspecified, uncomplicated: Secondary | ICD-10-CM

## 2021-04-27 DIAGNOSIS — D126 Benign neoplasm of colon, unspecified: Secondary | ICD-10-CM | POA: Diagnosis not present

## 2021-04-27 DIAGNOSIS — M19032 Primary osteoarthritis, left wrist: Secondary | ICD-10-CM | POA: Diagnosis not present

## 2021-04-27 DIAGNOSIS — R634 Abnormal weight loss: Secondary | ICD-10-CM | POA: Diagnosis not present

## 2021-04-29 ENCOUNTER — Other Ambulatory Visit: Payer: Self-pay | Admitting: Internal Medicine

## 2021-04-29 DIAGNOSIS — N289 Disorder of kidney and ureter, unspecified: Secondary | ICD-10-CM

## 2021-05-13 ENCOUNTER — Other Ambulatory Visit: Payer: Self-pay

## 2021-05-13 ENCOUNTER — Ambulatory Visit
Admission: RE | Admit: 2021-05-13 | Discharge: 2021-05-13 | Disposition: A | Discharge status: Home / Self Care | Payer: Medicare Other | Source: Ambulatory Visit | Attending: Internal Medicine | Admitting: Internal Medicine

## 2021-05-13 DIAGNOSIS — J4520 Mild intermittent asthma, uncomplicated: Secondary | ICD-10-CM | POA: Diagnosis not present

## 2021-05-13 DIAGNOSIS — N183 Chronic kidney disease, stage 3 unspecified: Secondary | ICD-10-CM | POA: Diagnosis not present

## 2021-05-13 DIAGNOSIS — Z122 Encounter for screening for malignant neoplasm of respiratory organs: Secondary | ICD-10-CM

## 2021-05-13 DIAGNOSIS — I10 Essential (primary) hypertension: Secondary | ICD-10-CM | POA: Diagnosis not present

## 2021-05-13 DIAGNOSIS — M19032 Primary osteoarthritis, left wrist: Secondary | ICD-10-CM | POA: Diagnosis not present

## 2021-05-13 DIAGNOSIS — F17210 Nicotine dependence, cigarettes, uncomplicated: Secondary | ICD-10-CM | POA: Diagnosis not present

## 2021-05-13 DIAGNOSIS — F17200 Nicotine dependence, unspecified, uncomplicated: Secondary | ICD-10-CM

## 2021-05-13 DIAGNOSIS — J45901 Unspecified asthma with (acute) exacerbation: Secondary | ICD-10-CM | POA: Diagnosis not present

## 2021-05-13 DIAGNOSIS — J449 Chronic obstructive pulmonary disease, unspecified: Secondary | ICD-10-CM | POA: Diagnosis not present

## 2021-05-17 ENCOUNTER — Ambulatory Visit
Admission: RE | Admit: 2021-05-17 | Discharge: 2021-05-17 | Disposition: A | Discharge status: Home / Self Care | Payer: Medicare Other | Source: Ambulatory Visit | Attending: Internal Medicine | Admitting: Internal Medicine

## 2021-05-17 DIAGNOSIS — N289 Disorder of kidney and ureter, unspecified: Secondary | ICD-10-CM

## 2021-05-17 DIAGNOSIS — N281 Cyst of kidney, acquired: Secondary | ICD-10-CM | POA: Diagnosis not present

## 2021-05-23 ENCOUNTER — Other Ambulatory Visit: Payer: Self-pay

## 2021-05-23 ENCOUNTER — Encounter (INDEPENDENT_AMBULATORY_CARE_PROVIDER_SITE_OTHER): Payer: Medicare Other | Admitting: Ophthalmology

## 2021-05-23 DIAGNOSIS — H43813 Vitreous degeneration, bilateral: Secondary | ICD-10-CM

## 2021-05-23 DIAGNOSIS — H353122 Nonexudative age-related macular degeneration, left eye, intermediate dry stage: Secondary | ICD-10-CM | POA: Diagnosis not present

## 2021-05-23 DIAGNOSIS — I10 Essential (primary) hypertension: Secondary | ICD-10-CM

## 2021-05-23 DIAGNOSIS — H35711 Central serous chorioretinopathy, right eye: Secondary | ICD-10-CM

## 2021-05-23 DIAGNOSIS — H35033 Hypertensive retinopathy, bilateral: Secondary | ICD-10-CM

## 2021-05-23 DIAGNOSIS — H353211 Exudative age-related macular degeneration, right eye, with active choroidal neovascularization: Secondary | ICD-10-CM | POA: Diagnosis not present

## 2021-05-24 DIAGNOSIS — D649 Anemia, unspecified: Secondary | ICD-10-CM | POA: Diagnosis not present

## 2021-06-07 DIAGNOSIS — D649 Anemia, unspecified: Secondary | ICD-10-CM | POA: Diagnosis not present

## 2021-06-21 DIAGNOSIS — Z86010 Personal history of colonic polyps: Secondary | ICD-10-CM | POA: Diagnosis not present

## 2021-06-21 DIAGNOSIS — J449 Chronic obstructive pulmonary disease, unspecified: Secondary | ICD-10-CM | POA: Diagnosis not present

## 2021-06-21 DIAGNOSIS — N183 Chronic kidney disease, stage 3 unspecified: Secondary | ICD-10-CM | POA: Diagnosis not present

## 2021-06-21 DIAGNOSIS — R195 Other fecal abnormalities: Secondary | ICD-10-CM | POA: Diagnosis not present

## 2021-06-21 DIAGNOSIS — D649 Anemia, unspecified: Secondary | ICD-10-CM | POA: Diagnosis not present

## 2021-06-23 DIAGNOSIS — K644 Residual hemorrhoidal skin tags: Secondary | ICD-10-CM | POA: Diagnosis not present

## 2021-06-23 DIAGNOSIS — K648 Other hemorrhoids: Secondary | ICD-10-CM | POA: Diagnosis not present

## 2021-06-23 DIAGNOSIS — D124 Benign neoplasm of descending colon: Secondary | ICD-10-CM | POA: Diagnosis not present

## 2021-06-23 DIAGNOSIS — R195 Other fecal abnormalities: Secondary | ICD-10-CM | POA: Diagnosis not present

## 2021-06-23 DIAGNOSIS — D120 Benign neoplasm of cecum: Secondary | ICD-10-CM | POA: Diagnosis not present

## 2021-06-23 DIAGNOSIS — K2289 Other specified disease of esophagus: Secondary | ICD-10-CM | POA: Diagnosis not present

## 2021-06-23 DIAGNOSIS — K5730 Diverticulosis of large intestine without perforation or abscess without bleeding: Secondary | ICD-10-CM | POA: Diagnosis not present

## 2021-06-23 DIAGNOSIS — D125 Benign neoplasm of sigmoid colon: Secondary | ICD-10-CM | POA: Diagnosis not present

## 2021-06-23 DIAGNOSIS — K319 Disease of stomach and duodenum, unspecified: Secondary | ICD-10-CM | POA: Diagnosis not present

## 2021-06-23 DIAGNOSIS — K2971 Gastritis, unspecified, with bleeding: Secondary | ICD-10-CM | POA: Diagnosis not present

## 2021-06-23 DIAGNOSIS — D127 Benign neoplasm of rectosigmoid junction: Secondary | ICD-10-CM | POA: Diagnosis not present

## 2021-06-23 DIAGNOSIS — D509 Iron deficiency anemia, unspecified: Secondary | ICD-10-CM | POA: Diagnosis not present

## 2021-06-30 DIAGNOSIS — D124 Benign neoplasm of descending colon: Secondary | ICD-10-CM | POA: Diagnosis not present

## 2021-06-30 DIAGNOSIS — D127 Benign neoplasm of rectosigmoid junction: Secondary | ICD-10-CM | POA: Diagnosis not present

## 2021-06-30 DIAGNOSIS — D125 Benign neoplasm of sigmoid colon: Secondary | ICD-10-CM | POA: Diagnosis not present

## 2021-06-30 DIAGNOSIS — K319 Disease of stomach and duodenum, unspecified: Secondary | ICD-10-CM | POA: Diagnosis not present

## 2021-06-30 DIAGNOSIS — D120 Benign neoplasm of cecum: Secondary | ICD-10-CM | POA: Diagnosis not present

## 2021-06-30 DIAGNOSIS — K2289 Other specified disease of esophagus: Secondary | ICD-10-CM | POA: Diagnosis not present

## 2021-07-04 ENCOUNTER — Encounter (INDEPENDENT_AMBULATORY_CARE_PROVIDER_SITE_OTHER): Payer: Medicare Other | Admitting: Ophthalmology

## 2021-07-04 ENCOUNTER — Other Ambulatory Visit: Payer: Self-pay

## 2021-07-04 DIAGNOSIS — H353121 Nonexudative age-related macular degeneration, left eye, early dry stage: Secondary | ICD-10-CM | POA: Diagnosis not present

## 2021-07-04 DIAGNOSIS — E113292 Type 2 diabetes mellitus with mild nonproliferative diabetic retinopathy without macular edema, left eye: Secondary | ICD-10-CM | POA: Diagnosis not present

## 2021-07-04 DIAGNOSIS — H35033 Hypertensive retinopathy, bilateral: Secondary | ICD-10-CM

## 2021-07-04 DIAGNOSIS — I10 Essential (primary) hypertension: Secondary | ICD-10-CM

## 2021-07-04 DIAGNOSIS — H43813 Vitreous degeneration, bilateral: Secondary | ICD-10-CM

## 2021-07-04 DIAGNOSIS — H353211 Exudative age-related macular degeneration, right eye, with active choroidal neovascularization: Secondary | ICD-10-CM

## 2021-08-02 DIAGNOSIS — R198 Other specified symptoms and signs involving the digestive system and abdomen: Secondary | ICD-10-CM | POA: Diagnosis not present

## 2021-08-02 DIAGNOSIS — D649 Anemia, unspecified: Secondary | ICD-10-CM | POA: Diagnosis not present

## 2021-08-15 ENCOUNTER — Other Ambulatory Visit: Payer: Self-pay

## 2021-08-15 ENCOUNTER — Encounter (INDEPENDENT_AMBULATORY_CARE_PROVIDER_SITE_OTHER): Payer: Medicare Other | Admitting: Ophthalmology

## 2021-08-15 DIAGNOSIS — H2512 Age-related nuclear cataract, left eye: Secondary | ICD-10-CM | POA: Diagnosis not present

## 2021-08-15 DIAGNOSIS — H353211 Exudative age-related macular degeneration, right eye, with active choroidal neovascularization: Secondary | ICD-10-CM

## 2021-08-15 DIAGNOSIS — H35711 Central serous chorioretinopathy, right eye: Secondary | ICD-10-CM

## 2021-08-15 DIAGNOSIS — H353121 Nonexudative age-related macular degeneration, left eye, early dry stage: Secondary | ICD-10-CM

## 2021-08-15 DIAGNOSIS — H35033 Hypertensive retinopathy, bilateral: Secondary | ICD-10-CM

## 2021-08-15 DIAGNOSIS — H43813 Vitreous degeneration, bilateral: Secondary | ICD-10-CM | POA: Diagnosis not present

## 2021-08-15 DIAGNOSIS — E113393 Type 2 diabetes mellitus with moderate nonproliferative diabetic retinopathy without macular edema, bilateral: Secondary | ICD-10-CM | POA: Diagnosis not present

## 2021-08-15 DIAGNOSIS — I10 Essential (primary) hypertension: Secondary | ICD-10-CM | POA: Diagnosis not present

## 2021-08-25 DIAGNOSIS — K3189 Other diseases of stomach and duodenum: Secondary | ICD-10-CM | POA: Diagnosis not present

## 2021-08-25 DIAGNOSIS — D130 Benign neoplasm of esophagus: Secondary | ICD-10-CM | POA: Diagnosis not present

## 2021-08-25 DIAGNOSIS — K2950 Unspecified chronic gastritis without bleeding: Secondary | ICD-10-CM | POA: Diagnosis not present

## 2021-08-25 DIAGNOSIS — B3781 Candidal esophagitis: Secondary | ICD-10-CM | POA: Diagnosis not present

## 2021-08-25 DIAGNOSIS — K2980 Duodenitis without bleeding: Secondary | ICD-10-CM | POA: Diagnosis not present

## 2021-08-25 DIAGNOSIS — K209 Esophagitis, unspecified without bleeding: Secondary | ICD-10-CM | POA: Diagnosis not present

## 2021-08-30 DIAGNOSIS — J449 Chronic obstructive pulmonary disease, unspecified: Secondary | ICD-10-CM | POA: Diagnosis not present

## 2021-08-30 DIAGNOSIS — I10 Essential (primary) hypertension: Secondary | ICD-10-CM | POA: Diagnosis not present

## 2021-08-30 DIAGNOSIS — J4520 Mild intermittent asthma, uncomplicated: Secondary | ICD-10-CM | POA: Diagnosis not present

## 2021-09-01 DIAGNOSIS — B3781 Candidal esophagitis: Secondary | ICD-10-CM | POA: Diagnosis not present

## 2021-09-01 DIAGNOSIS — D130 Benign neoplasm of esophagus: Secondary | ICD-10-CM | POA: Diagnosis not present

## 2021-09-01 DIAGNOSIS — K2980 Duodenitis without bleeding: Secondary | ICD-10-CM | POA: Diagnosis not present

## 2021-09-26 ENCOUNTER — Other Ambulatory Visit: Payer: Self-pay

## 2021-09-26 ENCOUNTER — Encounter (INDEPENDENT_AMBULATORY_CARE_PROVIDER_SITE_OTHER): Payer: Medicare Other | Admitting: Ophthalmology

## 2021-09-26 DIAGNOSIS — H35033 Hypertensive retinopathy, bilateral: Secondary | ICD-10-CM | POA: Diagnosis not present

## 2021-09-26 DIAGNOSIS — E113292 Type 2 diabetes mellitus with mild nonproliferative diabetic retinopathy without macular edema, left eye: Secondary | ICD-10-CM

## 2021-09-26 DIAGNOSIS — H2512 Age-related nuclear cataract, left eye: Secondary | ICD-10-CM | POA: Diagnosis not present

## 2021-09-26 DIAGNOSIS — H353121 Nonexudative age-related macular degeneration, left eye, early dry stage: Secondary | ICD-10-CM | POA: Diagnosis not present

## 2021-09-26 DIAGNOSIS — I10 Essential (primary) hypertension: Secondary | ICD-10-CM

## 2021-09-26 DIAGNOSIS — H353211 Exudative age-related macular degeneration, right eye, with active choroidal neovascularization: Secondary | ICD-10-CM | POA: Diagnosis not present

## 2021-09-26 DIAGNOSIS — H43813 Vitreous degeneration, bilateral: Secondary | ICD-10-CM

## 2021-09-29 DIAGNOSIS — J449 Chronic obstructive pulmonary disease, unspecified: Secondary | ICD-10-CM | POA: Diagnosis not present

## 2021-09-29 DIAGNOSIS — J4520 Mild intermittent asthma, uncomplicated: Secondary | ICD-10-CM | POA: Diagnosis not present

## 2021-09-29 DIAGNOSIS — I10 Essential (primary) hypertension: Secondary | ICD-10-CM | POA: Diagnosis not present

## 2021-10-24 DIAGNOSIS — D649 Anemia, unspecified: Secondary | ICD-10-CM | POA: Diagnosis not present

## 2021-11-07 ENCOUNTER — Encounter (INDEPENDENT_AMBULATORY_CARE_PROVIDER_SITE_OTHER): Payer: Medicare Other | Admitting: Ophthalmology

## 2021-11-07 DIAGNOSIS — H353121 Nonexudative age-related macular degeneration, left eye, early dry stage: Secondary | ICD-10-CM

## 2021-11-07 DIAGNOSIS — H43813 Vitreous degeneration, bilateral: Secondary | ICD-10-CM

## 2021-11-07 DIAGNOSIS — I10 Essential (primary) hypertension: Secondary | ICD-10-CM | POA: Diagnosis not present

## 2021-11-07 DIAGNOSIS — H35033 Hypertensive retinopathy, bilateral: Secondary | ICD-10-CM

## 2021-11-07 DIAGNOSIS — H35711 Central serous chorioretinopathy, right eye: Secondary | ICD-10-CM

## 2021-11-07 DIAGNOSIS — H353211 Exudative age-related macular degeneration, right eye, with active choroidal neovascularization: Secondary | ICD-10-CM | POA: Diagnosis not present

## 2021-11-07 DIAGNOSIS — E113292 Type 2 diabetes mellitus with mild nonproliferative diabetic retinopathy without macular edema, left eye: Secondary | ICD-10-CM | POA: Diagnosis not present

## 2021-11-09 ENCOUNTER — Telehealth: Payer: Self-pay | Admitting: Oncology

## 2021-11-09 NOTE — Telephone Encounter (Signed)
Attempted to contact patient to schedule initial visit from referral. No answer so voicemail was left to call back  ?

## 2021-12-06 ENCOUNTER — Other Ambulatory Visit: Payer: Self-pay | Admitting: *Deleted

## 2021-12-06 DIAGNOSIS — D649 Anemia, unspecified: Secondary | ICD-10-CM

## 2021-12-07 ENCOUNTER — Inpatient Hospital Stay: Payer: Medicare Other

## 2021-12-07 ENCOUNTER — Inpatient Hospital Stay: Payer: Medicare Other | Attending: Oncology | Admitting: Oncology

## 2021-12-07 VITALS — BP 148/78 | HR 89 | Temp 98.8°F | Resp 18 | Ht 69.0 in | Wt 150.0 lb

## 2021-12-07 DIAGNOSIS — N189 Chronic kidney disease, unspecified: Secondary | ICD-10-CM | POA: Diagnosis not present

## 2021-12-07 DIAGNOSIS — F17210 Nicotine dependence, cigarettes, uncomplicated: Secondary | ICD-10-CM | POA: Diagnosis not present

## 2021-12-07 DIAGNOSIS — D649 Anemia, unspecified: Secondary | ICD-10-CM

## 2021-12-07 DIAGNOSIS — B3781 Candidal esophagitis: Secondary | ICD-10-CM | POA: Insufficient documentation

## 2021-12-07 DIAGNOSIS — I129 Hypertensive chronic kidney disease with stage 1 through stage 4 chronic kidney disease, or unspecified chronic kidney disease: Secondary | ICD-10-CM | POA: Insufficient documentation

## 2021-12-07 DIAGNOSIS — I10 Essential (primary) hypertension: Secondary | ICD-10-CM

## 2021-12-07 DIAGNOSIS — N289 Disorder of kidney and ureter, unspecified: Secondary | ICD-10-CM | POA: Diagnosis not present

## 2021-12-07 DIAGNOSIS — K2281 Esophageal polyp: Secondary | ICD-10-CM | POA: Diagnosis not present

## 2021-12-07 DIAGNOSIS — J449 Chronic obstructive pulmonary disease, unspecified: Secondary | ICD-10-CM

## 2021-12-07 DIAGNOSIS — R195 Other fecal abnormalities: Secondary | ICD-10-CM | POA: Diagnosis not present

## 2021-12-07 LAB — CBC WITH DIFFERENTIAL (CANCER CENTER ONLY)
Abs Immature Granulocytes: 0.01 10*3/uL (ref 0.00–0.07)
Basophils Absolute: 0 10*3/uL (ref 0.0–0.1)
Basophils Relative: 1 %
Eosinophils Absolute: 0.1 10*3/uL (ref 0.0–0.5)
Eosinophils Relative: 1 %
HCT: 32.2 % — ABNORMAL LOW (ref 39.0–52.0)
Hemoglobin: 10.8 g/dL — ABNORMAL LOW (ref 13.0–17.0)
Immature Granulocytes: 0 %
Lymphocytes Relative: 25 %
Lymphs Abs: 1.6 10*3/uL (ref 0.7–4.0)
MCH: 32.1 pg (ref 26.0–34.0)
MCHC: 33.5 g/dL (ref 30.0–36.0)
MCV: 95.8 fL (ref 80.0–100.0)
Monocytes Absolute: 0.6 10*3/uL (ref 0.1–1.0)
Monocytes Relative: 10 %
Neutro Abs: 4 10*3/uL (ref 1.7–7.7)
Neutrophils Relative %: 63 %
Platelet Count: 242 10*3/uL (ref 150–400)
RBC: 3.36 MIL/uL — ABNORMAL LOW (ref 4.22–5.81)
RDW: 13.1 % (ref 11.5–15.5)
WBC Count: 6.3 10*3/uL (ref 4.0–10.5)
nRBC: 0 % (ref 0.0–0.2)

## 2021-12-07 LAB — CMP (CANCER CENTER ONLY)
ALT: 23 U/L (ref 0–44)
AST: 22 U/L (ref 15–41)
Albumin: 4.5 g/dL (ref 3.5–5.0)
Alkaline Phosphatase: 76 U/L (ref 38–126)
Anion gap: 10 (ref 5–15)
BUN: 34 mg/dL — ABNORMAL HIGH (ref 8–23)
CO2: 25 mmol/L (ref 22–32)
Calcium: 9.7 mg/dL (ref 8.9–10.3)
Chloride: 94 mmol/L — ABNORMAL LOW (ref 98–111)
Creatinine: 1.67 mg/dL — ABNORMAL HIGH (ref 0.61–1.24)
GFR, Estimated: 43 mL/min — ABNORMAL LOW (ref >60)
Glucose, Bld: 112 mg/dL — ABNORMAL HIGH (ref 70–99)
Potassium: 5.4 mmol/L — ABNORMAL HIGH (ref 3.5–5.1)
Sodium: 129 mmol/L — ABNORMAL LOW (ref 135–145)
Total Bilirubin: 0.5 mg/dL (ref 0.3–1.2)
Total Protein: 7.7 g/dL (ref 6.5–8.1)

## 2021-12-07 LAB — RETICULOCYTES
Immature Retic Fract: 11.2 % (ref 2.3–15.9)
RBC.: 3.42 MIL/uL — ABNORMAL LOW (ref 4.22–5.81)
Retic Count, Absolute: 115.6 10*3/uL (ref 19.0–186.0)
Retic Ct Pct: 3.4 % — ABNORMAL HIGH (ref 0.4–3.1)

## 2021-12-07 LAB — SAVE SMEAR(SSMR), FOR PROVIDER SLIDE REVIEW

## 2021-12-07 LAB — VITAMIN B12: Vitamin B-12: 314 pg/mL (ref 180–914)

## 2021-12-07 NOTE — Progress Notes (Signed)
[name] Consult   Requesting MD: Otis Brace, [address] [place],  [place] [address]   George Parrish 76 y.o.  Feb 10, 1946    Reason for Consult: Anemia   HPI: George Parrish was referred to Dr. Alessandra Bevels for evaluation of anemia and Hemoccult positive stool.  A CBC on 05/24/2021 found the hemoglobin 11.1, MCV 93, and ferritin at 269.  The vitamin B12 returned at 284.  Stool Hemoccult cards were positive on 05/09/2021..  On 05/22/2021 the iron returned at 88, TIBC 295, percent saturation 30.  He was taken to an upper endoscopy and colonoscopy on 06/23/2021.  Discoloration and granularity were found in the distal esophagus.  Moderate inflammation with hemorrhage, congestion, erosions, erythema were found in the stomach.  Biopsies were obtained.  Multiple polyps were removed from the colon.  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  There was also a hyperplastic polyp.  The biopsy from the distal esophagus revealed reactive squamous mucosa with papillary epithelial hyperplasia the stomach biopsies revealed reactive gastropathy.  No H. pylori.  He underwent a repeat upper endoscopy 08/25/2021.  Polyps were removed from the lower third of the esophagus.  White plaques were found in the proximal and middle esophagus.  Mild inflammation was found in the stomach.  Congestion was noted in the second portion of the duodenum.  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  The duodenal biopsy revealed chronic duodenitis.  And esophagus biopsy returned with Candida esophagitis.  George Parrish reports feeling completely well.  He is not aware of any previous history of anemia.    Past Medical History:  Diagnosis Date   Arthritis    Asthma    Headache(784.0)    Hypertension     .  Macular degeneration of the right eye   .  Chronic renal insufficiency  Past Surgical History:  Procedure  Laterality Date   COLONOSCOPY WITH PROPOFOL N/A 03/03/2014   Procedure: COLONOSCOPY WITH PROPOFOL;  Surgeon: Garlan Fair, MD;  Location: [facility];  Service: Endoscopy;  Laterality: N/A;   FINGER AMPUTATION  4-5 yrs ago    Medications: Reviewed  Allergies: No Known Allergies  Family history: No family history of cancer or hematologic condition  Social History:   He lives with his wife in [place].  He is retired from [employer].  He smokes 1 pack of cigarettes every 4 to 5 days.  He drinks a sixpack of beer each week.  He has received COVID-19 and influenza vaccines.  No transfusion history.  No risk factor for HIV or hepatitis.  ROS:   Positives include: None  A complete ROS was otherwise negative.  Physical Exam:  Blood pressure (!) 148/78, pulse 89, temperature 98.8 F (37.1 C), temperature source Oral, resp. rate 18, height 5' 9"  (1.753 m), weight 150 lb (68 kg), SpO2 100 %.  HEENT: Mild thrush at the posterior palate/upper pharynx, oropharynx without visible mass Lungs: Distant breath sounds, no respiratory distress Cardiac: Regular rate and rhythm Abdomen: No hepatosplenomegaly  Vascular: No leg edema Lymph nodes: No cervical, supraclavicular, axillary, or inguinal nodes Neurologic: Alert and oriented, the motor exam appears intact in the upper and lower extremities bilaterally Skin: No rash Musculoskeletal: No spine tenderness   LAB:  CBC  Lab Results  Component Value Date   WBC 6.3 12/07/2021   HGB 10.8 (L) 12/07/2021   HCT 32.2 (L) 12/07/2021   MCV 95.8  12/07/2021   PLT 242 12/07/2021   NEUTROABS 4.0 12/07/2021    Blood smear: The platelets appear normal in number, 1 small platelet clumps.  Few ovalocytes, teardrops, and target cells.  The polychromasia is not increased.  No nucleated red cells.  Mild rouleaux.  The majority the white cells are mature lymphocytes and neutrophils.  No blasts or other young forms are seen.  Few plasmacytoid  lymphocytes.       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  Renal insufficiency 4.  Hypertension 5.  COPD   Disposition:   George Parrish is referred for evaluation of anemia.  There is no obvious explanation for the anemia upon review of his history, physical examination, and peripheral blood smear.  He was noted to have a Hemoccult positive stool in November 2022.  Anemia may be in part related to gastritis, but he does not have laboratory evidence of iron deficiency.  The differential diagnosis includes myelodysplasia and bone marrow involvement with a lymphoproliferative disorder.  The anemia may be in part related to renal insufficiency.  We will obtain a repeat vitamin B12 level and multiple myeloma panel today.  He will return for an office visit and CBC in 3-4 weeks.  The plan is to proceed with a diagnostic bone marrow biopsy depending on the above laboratory evaluation and repeat CBC.  Betsy Coder, MD  12/07/2021, 10:51 AM

## 2021-12-08 ENCOUNTER — Telehealth: Payer: Self-pay

## 2021-12-08 ENCOUNTER — Other Ambulatory Visit: Payer: Self-pay

## 2021-12-08 DIAGNOSIS — D649 Anemia, unspecified: Secondary | ICD-10-CM

## 2021-12-08 LAB — KAPPA/LAMBDA LIGHT CHAINS
Kappa free light chain: 41.5 mg/L — ABNORMAL HIGH (ref 3.3–19.4)
Kappa, lambda light chain ratio: 1.33 (ref 0.26–1.65)
Lambda free light chains: 31.1 mg/L — ABNORMAL HIGH (ref 5.7–26.3)

## 2021-12-08 NOTE — Telephone Encounter (Signed)
-----   Message from Ladell Pier, MD sent at 12/07/2021  4:11 PM EDT ----- Please call patient, kidney function remains abnormal, potassium level is elevated, he needs a repeat BMP here or with primary provider next 2 days, copy report to primary provider

## 2021-12-08 NOTE — Telephone Encounter (Signed)
Pt verbalized understanding. Requested to have labs done at [facility]. Appointment scheduled for Friday at 8 am

## 2021-12-09 ENCOUNTER — Telehealth: Payer: Self-pay

## 2021-12-09 ENCOUNTER — Other Ambulatory Visit: Payer: Self-pay

## 2021-12-09 ENCOUNTER — Inpatient Hospital Stay: Payer: Medicare Other

## 2021-12-09 DIAGNOSIS — R195 Other fecal abnormalities: Secondary | ICD-10-CM | POA: Diagnosis not present

## 2021-12-09 DIAGNOSIS — N189 Chronic kidney disease, unspecified: Secondary | ICD-10-CM | POA: Diagnosis not present

## 2021-12-09 DIAGNOSIS — K2281 Esophageal polyp: Secondary | ICD-10-CM | POA: Diagnosis not present

## 2021-12-09 DIAGNOSIS — D649 Anemia, unspecified: Secondary | ICD-10-CM | POA: Diagnosis not present

## 2021-12-09 DIAGNOSIS — F17210 Nicotine dependence, cigarettes, uncomplicated: Secondary | ICD-10-CM | POA: Diagnosis not present

## 2021-12-09 DIAGNOSIS — J449 Chronic obstructive pulmonary disease, unspecified: Secondary | ICD-10-CM | POA: Diagnosis not present

## 2021-12-09 DIAGNOSIS — I129 Hypertensive chronic kidney disease with stage 1 through stage 4 chronic kidney disease, or unspecified chronic kidney disease: Secondary | ICD-10-CM | POA: Diagnosis not present

## 2021-12-09 DIAGNOSIS — B3781 Candidal esophagitis: Secondary | ICD-10-CM | POA: Diagnosis not present

## 2021-12-09 LAB — BASIC METABOLIC PANEL - CANCER CENTER ONLY
Anion gap: 8 (ref 5–15)
BUN: 30 mg/dL — ABNORMAL HIGH (ref 8–23)
CO2: 26 mmol/L (ref 22–32)
Calcium: 9.6 mg/dL (ref 8.9–10.3)
Chloride: 95 mmol/L — ABNORMAL LOW (ref 98–111)
Creatinine: 1.54 mg/dL — ABNORMAL HIGH (ref 0.61–1.24)
GFR, Estimated: 47 mL/min — ABNORMAL LOW (ref >60)
Glucose, Bld: 137 mg/dL — ABNORMAL HIGH (ref 70–99)
Potassium: 5 mmol/L (ref 3.5–5.1)
Sodium: 129 mmol/L — ABNORMAL LOW (ref 135–145)

## 2021-12-09 NOTE — Telephone Encounter (Signed)
-----   Message from Ladell Pier, MD sent at 12/09/2021  8:42 AM EDT ----- Please call patient, the potassium is better, follow-up as scheduled

## 2021-12-09 NOTE — Telephone Encounter (Signed)
Pt verbalized understanding.

## 2021-12-12 LAB — MULTIPLE MYELOMA PANEL, SERUM
Albumin SerPl Elph-Mcnc: 4.2 g/dL (ref 2.9–4.4)
Albumin/Glob SerPl: 1.3 (ref 0.7–1.7)
Alpha 1: 0.2 g/dL (ref 0.0–0.4)
Alpha2 Glob SerPl Elph-Mcnc: 0.7 g/dL (ref 0.4–1.0)
B-Globulin SerPl Elph-Mcnc: 1 g/dL (ref 0.7–1.3)
Gamma Glob SerPl Elph-Mcnc: 1.4 g/dL (ref 0.4–1.8)
Globulin, Total: 3.3 g/dL (ref 2.2–3.9)
IgA: 287 mg/dL (ref 61–437)
IgG (Immunoglobin G), Serum: 1350 mg/dL (ref 603–1613)
IgM (Immunoglobulin M), Srm: 101 mg/dL (ref 15–143)
M Protein SerPl Elph-Mcnc: 0.5 g/dL — ABNORMAL HIGH
Total Protein ELP: 7.5 g/dL (ref 6.0–8.5)

## 2021-12-16 ENCOUNTER — Other Ambulatory Visit: Payer: Self-pay

## 2021-12-16 ENCOUNTER — Telehealth: Payer: Self-pay

## 2021-12-16 DIAGNOSIS — D649 Anemia, unspecified: Secondary | ICD-10-CM

## 2021-12-16 NOTE — Telephone Encounter (Signed)
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

## 2021-12-16 NOTE — Telephone Encounter (Signed)
Pt verbalized understanding.

## 2021-12-19 ENCOUNTER — Encounter (INDEPENDENT_AMBULATORY_CARE_PROVIDER_SITE_OTHER): Payer: Medicare Other | Admitting: Ophthalmology

## 2021-12-19 DIAGNOSIS — H353121 Nonexudative age-related macular degeneration, left eye, early dry stage: Secondary | ICD-10-CM | POA: Diagnosis not present

## 2021-12-19 DIAGNOSIS — H35033 Hypertensive retinopathy, bilateral: Secondary | ICD-10-CM | POA: Diagnosis not present

## 2021-12-19 DIAGNOSIS — I10 Essential (primary) hypertension: Secondary | ICD-10-CM | POA: Diagnosis not present

## 2021-12-19 DIAGNOSIS — H43813 Vitreous degeneration, bilateral: Secondary | ICD-10-CM

## 2021-12-19 DIAGNOSIS — H353211 Exudative age-related macular degeneration, right eye, with active choroidal neovascularization: Secondary | ICD-10-CM

## 2021-12-19 DIAGNOSIS — H35711 Central serous chorioretinopathy, right eye: Secondary | ICD-10-CM | POA: Diagnosis not present

## 2021-12-19 DIAGNOSIS — E113292 Type 2 diabetes mellitus with mild nonproliferative diabetic retinopathy without macular edema, left eye: Secondary | ICD-10-CM | POA: Diagnosis not present

## 2021-12-29 ENCOUNTER — Inpatient Hospital Stay: Payer: Medicare Other | Admitting: Oncology

## 2021-12-29 ENCOUNTER — Inpatient Hospital Stay: Payer: Medicare Other

## 2022-01-19 ENCOUNTER — Other Ambulatory Visit: Payer: Self-pay | Admitting: Student

## 2022-01-19 DIAGNOSIS — D649 Anemia, unspecified: Secondary | ICD-10-CM

## 2022-01-19 NOTE — H&P (Addendum)
Chief Complaint: Patient was seen in consultation today for normocytic anemia  at the request of Sherrill,Gary B  Referring Physician(s): Ladell Pier  Supervising Physician: Aletta Edouard  Patient Status: [facility] - Out-pt  History of Present Illness: George Parrish is a 76 y.o. male with past medical history of asthma and hypertension.  Patient was referred to hematology/oncology for evaluation of normocytic anemia.  Patient was noted to have Hemoccult positive stool in November 2022 but felt like that may be related to gastritis due to not having lab evidence of iron deficiency.  Patient was referred to IR for bone marrow biopsy and aspiration with moderate sedation for normocytic anemia and to rule out multiple myeloma.  Past Medical History:  Diagnosis Date   Arthritis    Asthma    Headache(784.0)    Hypertension     Past Surgical History:  Procedure Laterality Date   COLONOSCOPY WITH PROPOFOL N/A 03/03/2014   Procedure: COLONOSCOPY WITH PROPOFOL;  Surgeon: Garlan Fair, MD;  Location: [facility];  Service: Endoscopy;  Laterality: N/A;   FINGER AMPUTATION  4-5 yrs ago    Allergies: Patient has no known allergies.  Medications: Prior to Admission medications   Medication Sig Start Date End Date Taking? Authorizing Provider  albuterol (PROVENTIL HFA;VENTOLIN HFA) 108 (90 BASE) MCG/ACT inhaler Inhale 1 puff into the lungs every 6 (six) hours as needed for wheezing or shortness of breath.    [provider]  amLODipine (NORVASC) 5 MG tablet Take 5 mg by mouth every evening.    [provider]  cholecalciferol (VITAMIN D) 1000 UNITS tablet Take 1,000 Units by mouth daily.    [provider]  eplerenone (INSPRA) 50 MG tablet Take 50 mg by mouth 2 (two) times daily. 12/01/21   [provider]  losartan-hydrochlorothiazide (HYZAAR) 100-25 MG per tablet Take 1 tablet by mouth every morning.    [provider]  TRELEGY ELLIPTA  100-62.5-25 MCG/ACT AEPB Inhale 1 puff into the lungs daily. 12/01/21   [provider]     History reviewed. No pertinent family history.  Social History   Socioeconomic History   Marital status: Married    Spouse name: Not on file   Number of children: Not on file   Years of education: Not on file   Highest education level: Not on file  Occupational History   Not on file  Tobacco Use   Smoking status: Every Day    Packs/day: 1.00    Years: 40.00    Total pack years: 40.00    Types: Cigarettes   Smokeless tobacco: Never   Tobacco comments:    Down to 1/3 pack per day on chantix  Substance and Sexual Activity   Alcohol use: Yes    Alcohol/week: 0.0 standard drinks of alcohol    Comment: beer 1-2 cans per day   Drug use: No   Sexual activity: Not on file  Other Topics Concern   Not on file  Social History Narrative   Not on file   Social Determinants of Health   Financial Resource Strain: Not on file  Food Insecurity: Not on file  Transportation Needs: Not on file  Physical Activity: Not on file  Stress: Not on file  Social Connections: Not on file     Review of Systems: A 12 point ROS discussed and pertinent positives are indicated in the HPI above.  All other systems are negative.  Review of Systems  All other systems reviewed  and are negative.   Vital Signs: BP (!) 145/85 (BP Location: Right Arm)   Pulse 93   Temp 98.4 F (36.9 C) (Oral)   Resp 15   Ht 5' 9"  (1.753 m)   Wt 150 lb (68 kg)   SpO2 100%   BMI 22.15 kg/m      Physical Exam Vitals reviewed.  Constitutional:      General: He is not in acute distress.    Appearance: Normal appearance. He is not ill-appearing.  HENT:     Head: Normocephalic and atraumatic.     Mouth/Throat:     Pharynx: Oropharynx is clear.  Eyes:     Extraocular Movements: Extraocular movements intact.  Cardiovascular:     Rate and Rhythm: Normal rate and regular rhythm.     Pulses: Normal pulses.      Heart sounds: Normal heart sounds.  Pulmonary:     Effort: Pulmonary effort is normal. No respiratory distress.     Breath sounds: Normal breath sounds.  Abdominal:     General: Bowel sounds are normal. There is no distension.     Palpations: Abdomen is soft.     Tenderness: There is no abdominal tenderness. There is no guarding.  Musculoskeletal:     Right lower leg: No edema.     Left lower leg: No edema.  Skin:    General: Skin is warm and dry.  Neurological:     Mental Status: He is alert and oriented to person, place, and time.  Psychiatric:        Mood and Affect: Mood normal.        Behavior: Behavior normal.        Thought Content: Thought content normal.        Judgment: Judgment normal.     Imaging: No results found.  Labs:  CBC: Recent Labs    12/07/21 0938 01/20/22 0926  WBC 6.3 6.4  HGB 10.8* 11.1*  HCT 32.2* 31.8*  PLT 242 230    COAGS: No results for input(s): "INR", "APTT" in the last 8760 hours.  BMP: Recent Labs    12/07/21 0938 12/09/21 0749  NA 129* 129*  K 5.4* 5.0  CL 94* 95*  CO2 25 26  GLUCOSE 112* 137*  BUN 34* 30*  CALCIUM 9.7 9.6  CREATININE 1.67* 1.54*  GFRNONAA 43* 47*    LIVER FUNCTION TESTS: Recent Labs    12/07/21 0938  BILITOT 0.5  AST 22  ALT 23  ALKPHOS 76  PROT 7.7  ALBUMIN 4.5    TUMOR MARKERS: No results for input(s): "AFPTM", "CEA", "CA199", "CHROMGRNA" in the last 8760 hours.  Assessment and Plan: [name] is a 76 year old male with past medical history of asthma and hypertension.  Patient was referred to hematology/oncology for evaluation of normocytic anemia.  Patient was noted to have Hemoccult positive stool in November 2022 but felt like that may be related to gastritis due to not having lab evidence of iron deficiency.  Patient was referred to IR for bone marrow biopsy and aspiration with moderate sedation for normocytic anemia and to rule out multiple myeloma.  Pt resting on stretcher  watching TV.  He is A&O, calm and pleasant.  He is in no distress.  Pt states he drank a glass of water with his meds around 0630 this morning.  Risks and benefits of bone marrow biopsy and aspiration with moderate sedation was discussed with the patient and/or patient's family including, but not limited to  bleeding, infection, damage to adjacent structures or low yield requiring additional tests.  All of the questions were answered and there is agreement to proceed.  Consent signed and in chart.   Thank you for this interesting consult.  I greatly enjoyed meeting George Parrish and look forward to participating in their care.  A copy of this report was sent to the requesting provider on this date.  Electronically Signed: Tyson Alias, NP 01/20/2022, 10:39 AM   I spent a total of 20 minutes in face to face in clinical consultation, greater than 50% of which was counseling/coordinating care for normocytic anemia.

## 2022-01-20 ENCOUNTER — Ambulatory Visit (HOSPITAL_COMMUNITY)
Admission: RE | Admit: 2022-01-20 | Discharge: 2022-01-20 | Disposition: A | Discharge status: Home / Self Care | Payer: Medicare Other | Source: Ambulatory Visit | Attending: Oncology | Admitting: Oncology

## 2022-01-20 ENCOUNTER — Encounter (HOSPITAL_COMMUNITY): Payer: Self-pay

## 2022-01-20 ENCOUNTER — Other Ambulatory Visit: Payer: Self-pay

## 2022-01-20 DIAGNOSIS — D649 Anemia, unspecified: Secondary | ICD-10-CM | POA: Insufficient documentation

## 2022-01-20 DIAGNOSIS — J45909 Unspecified asthma, uncomplicated: Secondary | ICD-10-CM | POA: Diagnosis not present

## 2022-01-20 DIAGNOSIS — C9000 Multiple myeloma not having achieved remission: Secondary | ICD-10-CM | POA: Insufficient documentation

## 2022-01-20 DIAGNOSIS — D72822 Plasmacytosis: Secondary | ICD-10-CM | POA: Insufficient documentation

## 2022-01-20 DIAGNOSIS — I10 Essential (primary) hypertension: Secondary | ICD-10-CM | POA: Insufficient documentation

## 2022-01-20 LAB — CBC WITH DIFFERENTIAL/PLATELET
Abs Immature Granulocytes: 0.04 10*3/uL (ref 0.00–0.07)
Basophils Absolute: 0 10*3/uL (ref 0.0–0.1)
Basophils Relative: 1 %
Eosinophils Absolute: 0.1 10*3/uL (ref 0.0–0.5)
Eosinophils Relative: 1 %
HCT: 31.8 % — ABNORMAL LOW (ref 39.0–52.0)
Hemoglobin: 11.1 g/dL — ABNORMAL LOW (ref 13.0–17.0)
Immature Granulocytes: 1 %
Lymphocytes Relative: 26 %
Lymphs Abs: 1.6 10*3/uL (ref 0.7–4.0)
MCH: 33.6 pg (ref 26.0–34.0)
MCHC: 34.9 g/dL (ref 30.0–36.0)
MCV: 96.4 fL (ref 80.0–100.0)
Monocytes Absolute: 0.6 10*3/uL (ref 0.1–1.0)
Monocytes Relative: 10 %
Neutro Abs: 4 10*3/uL (ref 1.7–7.7)
Neutrophils Relative %: 61 %
Platelets: 230 10*3/uL (ref 150–400)
RBC: 3.3 MIL/uL — ABNORMAL LOW (ref 4.22–5.81)
RDW: 12.9 % (ref 11.5–15.5)
WBC: 6.4 10*3/uL (ref 4.0–10.5)
nRBC: 0 % (ref 0.0–0.2)

## 2022-01-20 MED ORDER — MIDAZOLAM HCL 2 MG/2ML IJ SOLN
INTRAMUSCULAR | Status: AC | PRN
  Administered 2022-01-20: 1 mg via INTRAVENOUS

## 2022-01-20 MED ORDER — NALOXONE HCL 0.4 MG/ML IJ SOLN
INTRAMUSCULAR | Status: AC
  Filled 2022-01-20: qty 1

## 2022-01-20 MED ORDER — TRELEGY ELLIPTA 100-62.5-25 MCG/ACT IN AEPB: 2.0000 | INHALATION_SPRAY | Freq: Every day | RESPIRATORY_TRACT | 12 refills | Status: DC

## 2022-01-20 MED ORDER — FENTANYL CITRATE (PF) 100 MCG/2ML IJ SOLN
INTRAMUSCULAR | Status: AC
  Filled 2022-01-20: qty 2

## 2022-01-20 MED ORDER — LIDOCAINE HCL (PF) 1 % IJ SOLN
INTRAMUSCULAR | Status: AC | PRN
  Administered 2022-01-20: 10 mL via INTRADERMAL

## 2022-01-20 MED ORDER — SODIUM CHLORIDE 0.9 % IV SOLN: INTRAVENOUS | Status: DC

## 2022-01-20 MED ORDER — FENTANYL CITRATE (PF) 100 MCG/2ML IJ SOLN
INTRAMUSCULAR | Status: AC | PRN
  Administered 2022-01-20: 50 ug via INTRAVENOUS

## 2022-01-20 MED ORDER — MIDAZOLAM HCL 2 MG/2ML IJ SOLN
INTRAMUSCULAR | Status: AC
  Filled 2022-01-20: qty 2

## 2022-01-20 MED ORDER — FLUMAZENIL 0.5 MG/5ML IV SOLN
INTRAVENOUS | Status: AC
  Filled 2022-01-20: qty 5

## 2022-01-20 NOTE — Discharge Instructions (Signed)

## 2022-01-20 NOTE — Procedures (Signed)
Interventional Radiology Procedure Note  Procedure: CT guided bone marrow aspiration and biopsy  Complications: None  EBL: < 10 mL  Findings: Aspirate and core biopsy performed of bone marrow in right iliac bone.  Plan: Bedrest supine x 1 hrs  Darletta Noblett T. Delaina Fetsch, M.D Pager:  [phone]   

## 2022-01-23 ENCOUNTER — Encounter (INDEPENDENT_AMBULATORY_CARE_PROVIDER_SITE_OTHER): Payer: Medicare Other | Admitting: Ophthalmology

## 2022-01-23 DIAGNOSIS — R198 Other specified symptoms and signs involving the digestive system and abdomen: Secondary | ICD-10-CM | POA: Diagnosis not present

## 2022-01-23 DIAGNOSIS — D649 Anemia, unspecified: Secondary | ICD-10-CM | POA: Diagnosis not present

## 2022-01-24 ENCOUNTER — Encounter (INDEPENDENT_AMBULATORY_CARE_PROVIDER_SITE_OTHER): Payer: Medicare Other | Admitting: Ophthalmology

## 2022-01-24 DIAGNOSIS — H35033 Hypertensive retinopathy, bilateral: Secondary | ICD-10-CM | POA: Diagnosis not present

## 2022-01-24 DIAGNOSIS — I10 Essential (primary) hypertension: Secondary | ICD-10-CM

## 2022-01-24 DIAGNOSIS — H43813 Vitreous degeneration, bilateral: Secondary | ICD-10-CM | POA: Diagnosis not present

## 2022-01-24 DIAGNOSIS — H353121 Nonexudative age-related macular degeneration, left eye, early dry stage: Secondary | ICD-10-CM

## 2022-01-24 DIAGNOSIS — H353211 Exudative age-related macular degeneration, right eye, with active choroidal neovascularization: Secondary | ICD-10-CM | POA: Diagnosis not present

## 2022-01-25 ENCOUNTER — Inpatient Hospital Stay: Payer: Medicare Other | Attending: Oncology | Admitting: Oncology

## 2022-01-25 ENCOUNTER — Inpatient Hospital Stay: Payer: Medicare Other

## 2022-01-25 VITALS — BP 141/78 | HR 99 | Temp 98.1°F | Resp 18 | Ht 69.0 in | Wt 148.6 lb

## 2022-01-25 DIAGNOSIS — K209 Esophagitis, unspecified without bleeding: Secondary | ICD-10-CM | POA: Diagnosis not present

## 2022-01-25 DIAGNOSIS — D72822 Plasmacytosis: Secondary | ICD-10-CM | POA: Diagnosis not present

## 2022-01-25 DIAGNOSIS — I10 Essential (primary) hypertension: Secondary | ICD-10-CM | POA: Insufficient documentation

## 2022-01-25 DIAGNOSIS — D649 Anemia, unspecified: Secondary | ICD-10-CM | POA: Diagnosis not present

## 2022-01-25 DIAGNOSIS — J449 Chronic obstructive pulmonary disease, unspecified: Secondary | ICD-10-CM | POA: Diagnosis not present

## 2022-01-25 DIAGNOSIS — N289 Disorder of kidney and ureter, unspecified: Secondary | ICD-10-CM | POA: Insufficient documentation

## 2022-01-25 DIAGNOSIS — D126 Benign neoplasm of colon, unspecified: Secondary | ICD-10-CM | POA: Diagnosis not present

## 2022-01-25 LAB — CBC WITH DIFFERENTIAL (CANCER CENTER ONLY)
Abs Immature Granulocytes: 0.03 10*3/uL (ref 0.00–0.07)
Basophils Absolute: 0 10*3/uL (ref 0.0–0.1)
Basophils Relative: 0 %
Eosinophils Absolute: 0.1 10*3/uL (ref 0.0–0.5)
Eosinophils Relative: 1 %
HCT: 32.2 % — ABNORMAL LOW (ref 39.0–52.0)
Hemoglobin: 11 g/dL — ABNORMAL LOW (ref 13.0–17.0)
Immature Granulocytes: 0 %
Lymphocytes Relative: 19 %
Lymphs Abs: 1.4 10*3/uL (ref 0.7–4.0)
MCH: 32.4 pg (ref 26.0–34.0)
MCHC: 34.2 g/dL (ref 30.0–36.0)
MCV: 94.7 fL (ref 80.0–100.0)
Monocytes Absolute: 0.9 10*3/uL (ref 0.1–1.0)
Monocytes Relative: 13 %
Neutro Abs: 4.8 10*3/uL (ref 1.7–7.7)
Neutrophils Relative %: 67 %
Platelet Count: 240 10*3/uL (ref 150–400)
RBC: 3.4 MIL/uL — ABNORMAL LOW (ref 4.22–5.81)
RDW: 12.3 % (ref 11.5–15.5)
WBC Count: 7.2 10*3/uL (ref 4.0–10.5)
nRBC: 0 % (ref 0.0–0.2)

## 2022-01-25 NOTE — Progress Notes (Signed)
  George Parrish OFFICE PROGRESS NOTE   Diagnosis: Anemia  INTERVAL HISTORY:   George Parrish returns as scheduled.  He feels well.  Good appetite.  No fever or night sweats.  No recent infection. He underwent about marrow biopsy 01/20/2022.  He tolerated procedure well.  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  Serum immunoglobulin levels were normal.  He is scheduled to undergo a camera endoscopy with Dr. Rosalie Parrish. Objective:  Vital signs in last 24 hours:  Blood pressure (!) 141/78, pulse 99, temperature 98.1 F (36.7 C), temperature source Oral, resp. rate 18, height $RemoveBe'5\' 9"'kVaZiNEJf$  (1.753 m), weight 148 lb 9.6 oz (67.4 kg), SpO2 99 %.   Lymphatics: No cervical, supraclavicular, axillary, or inguinal nodes Resp: Distant breath sounds, scattered end inspiratory rhonchi and end expiratory wheeze, no respiratory distress Cardio: Regular rate and rhythm GI: No hepatosplenomegaly Vascular: No leg edema   Lab Results:  Lab Results  Component Value Date   WBC 7.2 01/25/2022   HGB 11.0 (L) 01/25/2022   HCT 32.2 (L) 01/25/2022   MCV 94.7 01/25/2022   PLT 240 01/25/2022   NEUTROABS 4.8 01/25/2022    CMP  Lab Results  Component Value Date   NA 129 (L) 12/09/2021   K 5.0 12/09/2021   CL 95 (L) 12/09/2021   CO2 26 12/09/2021   GLUCOSE 137 (H) 12/09/2021   BUN 30 (H) 12/09/2021   CREATININE 1.54 (H) 12/09/2021   CALCIUM 9.6 12/09/2021   PROT 7.7 12/07/2021   ALBUMIN 4.5 12/07/2021   AST 22 12/07/2021   ALT 23 12/07/2021   ALKPHOS 76 12/07/2021   BILITOT 0.5 12/07/2021   GFRNONAA 47 (L) 12/09/2021      Medications: I have reviewed the patient's current medications.   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  Renal insufficiency 4.  Hypertension 5.  COPD 6.  Bone marrow biopsy 01/20/2022-mild plasmacytosis, clonality not established 0.5 g serum M spike 12/07/2021 Serum immunofixation 12/07/2021-biclonal IgG lambda protein Normal immunoglobulin levels 12/07/2021 Mild elevation of kappa and lambda light chain 12/07/2021     Disposition: George Parrish was referred for evaluation of mild normocytic anemia.  The hemoglobin is stable.  He has a serum monoclonal protein and a bone marrow biopsy reveals a mild plasmacytosis.  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  We will follow-up on cytogenetics and a myeloma FISH panel from the bone marrow biopsy.  He will return for office visit, CBC, and repeat myeloma panel in 3 months.  George Coder, MD  01/25/2022  9:51 AM

## 2022-01-26 ENCOUNTER — Encounter (HOSPITAL_COMMUNITY): Payer: Self-pay | Admitting: Oncology

## 2022-01-26 LAB — SURGICAL PATHOLOGY

## 2022-02-28 ENCOUNTER — Encounter (INDEPENDENT_AMBULATORY_CARE_PROVIDER_SITE_OTHER): Payer: Medicare Other | Admitting: Ophthalmology

## 2022-02-28 DIAGNOSIS — H353211 Exudative age-related macular degeneration, right eye, with active choroidal neovascularization: Secondary | ICD-10-CM

## 2022-02-28 DIAGNOSIS — H35033 Hypertensive retinopathy, bilateral: Secondary | ICD-10-CM

## 2022-02-28 DIAGNOSIS — H43813 Vitreous degeneration, bilateral: Secondary | ICD-10-CM

## 2022-02-28 DIAGNOSIS — H353121 Nonexudative age-related macular degeneration, left eye, early dry stage: Secondary | ICD-10-CM | POA: Diagnosis not present

## 2022-02-28 DIAGNOSIS — I10 Essential (primary) hypertension: Secondary | ICD-10-CM

## 2022-03-03 DIAGNOSIS — K922 Gastrointestinal hemorrhage, unspecified: Secondary | ICD-10-CM | POA: Diagnosis not present

## 2022-04-04 ENCOUNTER — Encounter (INDEPENDENT_AMBULATORY_CARE_PROVIDER_SITE_OTHER): Payer: Medicare Other | Admitting: Ophthalmology

## 2022-04-04 DIAGNOSIS — H43813 Vitreous degeneration, bilateral: Secondary | ICD-10-CM

## 2022-04-04 DIAGNOSIS — H353122 Nonexudative age-related macular degeneration, left eye, intermediate dry stage: Secondary | ICD-10-CM | POA: Diagnosis not present

## 2022-04-04 DIAGNOSIS — I10 Essential (primary) hypertension: Secondary | ICD-10-CM

## 2022-04-04 DIAGNOSIS — H353211 Exudative age-related macular degeneration, right eye, with active choroidal neovascularization: Secondary | ICD-10-CM

## 2022-04-04 DIAGNOSIS — H35033 Hypertensive retinopathy, bilateral: Secondary | ICD-10-CM

## 2022-04-27 ENCOUNTER — Inpatient Hospital Stay: Payer: Medicare Other | Attending: Oncology

## 2022-04-27 ENCOUNTER — Inpatient Hospital Stay: Payer: Medicare Other | Admitting: Oncology

## 2022-04-27 ENCOUNTER — Encounter: Payer: Self-pay | Admitting: *Deleted

## 2022-04-27 VITALS — BP 138/84 | HR 84 | Temp 98.1°F | Resp 20 | Ht 69.0 in | Wt 149.6 lb

## 2022-04-27 DIAGNOSIS — D72822 Plasmacytosis: Secondary | ICD-10-CM | POA: Diagnosis not present

## 2022-04-27 DIAGNOSIS — D649 Anemia, unspecified: Secondary | ICD-10-CM | POA: Insufficient documentation

## 2022-04-27 DIAGNOSIS — I10 Essential (primary) hypertension: Secondary | ICD-10-CM | POA: Diagnosis not present

## 2022-04-27 DIAGNOSIS — N289 Disorder of kidney and ureter, unspecified: Secondary | ICD-10-CM | POA: Insufficient documentation

## 2022-04-27 DIAGNOSIS — J449 Chronic obstructive pulmonary disease, unspecified: Secondary | ICD-10-CM | POA: Diagnosis not present

## 2022-04-27 LAB — CBC WITH DIFFERENTIAL (CANCER CENTER ONLY)
Abs Immature Granulocytes: 0.03 10*3/uL (ref 0.00–0.07)
Basophils Absolute: 0 10*3/uL (ref 0.0–0.1)
Basophils Relative: 0 %
Eosinophils Absolute: 0.1 10*3/uL (ref 0.0–0.5)
Eosinophils Relative: 2 %
HCT: 32.7 % — ABNORMAL LOW (ref 39.0–52.0)
Hemoglobin: 11 g/dL — ABNORMAL LOW (ref 13.0–17.0)
Immature Granulocytes: 0 %
Lymphocytes Relative: 24 %
Lymphs Abs: 1.8 10*3/uL (ref 0.7–4.0)
MCH: 32.9 pg (ref 26.0–34.0)
MCHC: 33.6 g/dL (ref 30.0–36.0)
MCV: 97.9 fL (ref 80.0–100.0)
Monocytes Absolute: 0.8 10*3/uL (ref 0.1–1.0)
Monocytes Relative: 10 %
Neutro Abs: 4.9 10*3/uL (ref 1.7–7.7)
Neutrophils Relative %: 64 %
Platelet Count: 270 10*3/uL (ref 150–400)
RBC: 3.34 MIL/uL — ABNORMAL LOW (ref 4.22–5.81)
RDW: 12.8 % (ref 11.5–15.5)
WBC Count: 7.7 10*3/uL (ref 4.0–10.5)
nRBC: 0 % (ref 0.0–0.2)

## 2022-04-27 LAB — BASIC METABOLIC PANEL - CANCER CENTER ONLY
Anion gap: 9 (ref 5–15)
BUN: 31 mg/dL — ABNORMAL HIGH (ref 8–23)
CO2: 25 mmol/L (ref 22–32)
Calcium: 9.9 mg/dL (ref 8.9–10.3)
Chloride: 97 mmol/L — ABNORMAL LOW (ref 98–111)
Creatinine: 1.62 mg/dL — ABNORMAL HIGH (ref 0.61–1.24)
GFR, Estimated: 44 mL/min — ABNORMAL LOW (ref >60)
Glucose, Bld: 117 mg/dL — ABNORMAL HIGH (ref 70–99)
Potassium: 5.4 mmol/L — ABNORMAL HIGH (ref 3.5–5.1)
Sodium: 131 mmol/L — ABNORMAL LOW (ref 135–145)

## 2022-04-27 NOTE — Progress Notes (Signed)
  [facility] OFFICE PROGRESS NOTE   Diagnosis: Anemia, serum monoclonal protein  INTERVAL HISTORY:   Mr. Kistler returns as scheduled.  He feels well.  No complaint.  Objective:  Vital signs in last 24 hours:  Blood pressure 138/84, pulse 84, temperature 98.1 F (36.7 C), temperature source Oral, resp. rate 20, height $RemoveBe'5\' 9"'ShonaGWUE$  (1.753 m), weight 149 lb 9.6 oz (67.9 kg), SpO2 100 %.   Lymphatics: No cervical, supraclavicular, axillary, or inguinal nodes Resp: Distant breath sounds with bilateral expiratory wheeze, no respiratory distress Cardio: Regular rate and rhythm GI: No hepatosplenomegaly Vascular: No leg edema   Lab Results:  Lab Results  Component Value Date   WBC 7.7 04/27/2022   HGB 11.0 (L) 04/27/2022   HCT 32.7 (L) 04/27/2022   MCV 97.9 04/27/2022   PLT 270 04/27/2022   NEUTROABS 4.9 04/27/2022    CMP  Lab Results  Component Value Date   NA 131 (L) 04/27/2022   K 5.4 (H) 04/27/2022   CL 97 (L) 04/27/2022   CO2 25 04/27/2022   GLUCOSE 117 (H) 04/27/2022   BUN 31 (H) 04/27/2022   CREATININE 1.62 (H) 04/27/2022   CALCIUM 9.9 04/27/2022   PROT 7.7 12/07/2021   ALBUMIN 4.5 12/07/2021   AST 22 12/07/2021   ALT 23 12/07/2021   ALKPHOS 76 12/07/2021   BILITOT 0.5 12/07/2021   GFRNONAA 44 (L) 04/27/2022     Medications: I have reviewed the patient's current medications.   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   Renal insufficiency 4.  Hypertension 5.  COPD 6.  Bone marrow biopsy 01/20/2022-mild plasmacytosis, clonality not established 0.5 g serum M spike 12/07/2021 Serum immunofixation 12/07/2021-biclonal IgG lambda protein Normal immunoglobulin levels 12/07/2021 Mild elevation of kappa and lambda light chain 12/07/2021      Disposition: Mr. Vanloan appears well.  He is stable from a hematologic standpoint.  We will follow-up on the serum monoclonal protein today.  He has mild anemia, potentially related to early myeloma versus renal insufficiency.  The potassium is elevated today.  We will forward this result to [facility].  The potassium has been elevated in the past.  Mr. Tetzloff will return for an office visit in 4 months.  He plans to obtain a COVID-19 vaccine today.  Betsy Coder, MD  04/27/2022  9:18 AM

## 2022-04-27 NOTE — Progress Notes (Signed)
Faxed CBC/diff and BMP results to [facility] [phone].

## 2022-04-28 LAB — KAPPA/LAMBDA LIGHT CHAINS
Kappa free light chain: 48 mg/L — ABNORMAL HIGH (ref 3.3–19.4)
Kappa, lambda light chain ratio: 1.21 (ref 0.26–1.65)
Lambda free light chains: 39.8 mg/L — ABNORMAL HIGH (ref 5.7–26.3)

## 2022-05-02 LAB — PROTEIN ELECTROPHORESIS, SERUM
A/G Ratio: 1.1 (ref 0.7–1.7)
Albumin ELP: 4 g/dL (ref 2.9–4.4)
Alpha-1-Globulin: 0.2 g/dL (ref 0.0–0.4)
Alpha-2-Globulin: 0.8 g/dL (ref 0.4–1.0)
Beta Globulin: 1 g/dL (ref 0.7–1.3)
Gamma Globulin: 1.5 g/dL (ref 0.4–1.8)
Globulin, Total: 3.5 g/dL (ref 2.2–3.9)
M-Spike, %: 0.5 g/dL — ABNORMAL HIGH
Total Protein ELP: 7.5 g/dL (ref 6.0–8.5)

## 2022-05-03 ENCOUNTER — Telehealth: Payer: Self-pay | Admitting: *Deleted

## 2022-05-03 NOTE — Telephone Encounter (Signed)
-----   Message from Ladell Pier, MD sent at 05/02/2022  4:59 PM EDT ----- Please call patient, the serum monoclonal protein is stable, follow-up as scheduled

## 2022-05-03 NOTE — Telephone Encounter (Signed)
Left VM that serum monoclonal protein is stable and f/u as scheduled.

## 2022-05-08 ENCOUNTER — Encounter (INDEPENDENT_AMBULATORY_CARE_PROVIDER_SITE_OTHER): Payer: Medicare Other | Admitting: Ophthalmology

## 2022-05-08 DIAGNOSIS — E113293 Type 2 diabetes mellitus with mild nonproliferative diabetic retinopathy without macular edema, bilateral: Secondary | ICD-10-CM | POA: Diagnosis not present

## 2022-05-08 DIAGNOSIS — H35033 Hypertensive retinopathy, bilateral: Secondary | ICD-10-CM | POA: Diagnosis not present

## 2022-05-08 DIAGNOSIS — I10 Essential (primary) hypertension: Secondary | ICD-10-CM | POA: Diagnosis not present

## 2022-05-08 DIAGNOSIS — H43813 Vitreous degeneration, bilateral: Secondary | ICD-10-CM

## 2022-05-08 DIAGNOSIS — H353211 Exudative age-related macular degeneration, right eye, with active choroidal neovascularization: Secondary | ICD-10-CM | POA: Diagnosis not present

## 2022-05-08 DIAGNOSIS — H353122 Nonexudative age-related macular degeneration, left eye, intermediate dry stage: Secondary | ICD-10-CM

## 2022-05-18 ENCOUNTER — Other Ambulatory Visit: Payer: Self-pay | Admitting: Internal Medicine

## 2022-05-18 DIAGNOSIS — Z122 Encounter for screening for malignant neoplasm of respiratory organs: Secondary | ICD-10-CM | POA: Diagnosis not present

## 2022-05-18 DIAGNOSIS — F17210 Nicotine dependence, cigarettes, uncomplicated: Secondary | ICD-10-CM

## 2022-05-18 DIAGNOSIS — Z0000 Encounter for general adult medical examination without abnormal findings: Secondary | ICD-10-CM | POA: Diagnosis not present

## 2022-05-18 DIAGNOSIS — I10 Essential (primary) hypertension: Secondary | ICD-10-CM | POA: Diagnosis not present

## 2022-05-18 DIAGNOSIS — J4520 Mild intermittent asthma, uncomplicated: Secondary | ICD-10-CM | POA: Diagnosis not present

## 2022-05-18 DIAGNOSIS — D126 Benign neoplasm of colon, unspecified: Secondary | ICD-10-CM | POA: Diagnosis not present

## 2022-05-18 DIAGNOSIS — Z1211 Encounter for screening for malignant neoplasm of colon: Secondary | ICD-10-CM | POA: Diagnosis not present

## 2022-05-18 DIAGNOSIS — J449 Chronic obstructive pulmonary disease, unspecified: Secondary | ICD-10-CM | POA: Diagnosis not present

## 2022-05-18 DIAGNOSIS — M19032 Primary osteoarthritis, left wrist: Secondary | ICD-10-CM | POA: Diagnosis not present

## 2022-05-18 DIAGNOSIS — Z0001 Encounter for general adult medical examination with abnormal findings: Secondary | ICD-10-CM | POA: Diagnosis not present

## 2022-05-18 DIAGNOSIS — E559 Vitamin D deficiency, unspecified: Secondary | ICD-10-CM | POA: Diagnosis not present

## 2022-05-18 DIAGNOSIS — D649 Anemia, unspecified: Secondary | ICD-10-CM | POA: Diagnosis not present

## 2022-05-18 DIAGNOSIS — N183 Chronic kidney disease, stage 3 unspecified: Secondary | ICD-10-CM | POA: Diagnosis not present

## 2022-05-31 DIAGNOSIS — J4520 Mild intermittent asthma, uncomplicated: Secondary | ICD-10-CM | POA: Diagnosis not present

## 2022-05-31 DIAGNOSIS — N183 Chronic kidney disease, stage 3 unspecified: Secondary | ICD-10-CM | POA: Diagnosis not present

## 2022-05-31 DIAGNOSIS — M19032 Primary osteoarthritis, left wrist: Secondary | ICD-10-CM | POA: Diagnosis not present

## 2022-05-31 DIAGNOSIS — I10 Essential (primary) hypertension: Secondary | ICD-10-CM | POA: Diagnosis not present

## 2022-05-31 DIAGNOSIS — J449 Chronic obstructive pulmonary disease, unspecified: Secondary | ICD-10-CM | POA: Diagnosis not present

## 2022-06-06 DIAGNOSIS — R198 Other specified symptoms and signs involving the digestive system and abdomen: Secondary | ICD-10-CM | POA: Diagnosis not present

## 2022-06-06 DIAGNOSIS — D649 Anemia, unspecified: Secondary | ICD-10-CM | POA: Diagnosis not present

## 2022-06-12 ENCOUNTER — Encounter (INDEPENDENT_AMBULATORY_CARE_PROVIDER_SITE_OTHER): Payer: Medicare Other | Admitting: Ophthalmology

## 2022-06-15 ENCOUNTER — Encounter (INDEPENDENT_AMBULATORY_CARE_PROVIDER_SITE_OTHER): Payer: Medicare Other | Admitting: Ophthalmology

## 2022-06-16 ENCOUNTER — Encounter (INDEPENDENT_AMBULATORY_CARE_PROVIDER_SITE_OTHER): Payer: Medicare Other | Admitting: Ophthalmology

## 2022-06-16 DIAGNOSIS — I10 Essential (primary) hypertension: Secondary | ICD-10-CM | POA: Diagnosis not present

## 2022-06-16 DIAGNOSIS — H35033 Hypertensive retinopathy, bilateral: Secondary | ICD-10-CM

## 2022-06-16 DIAGNOSIS — H43813 Vitreous degeneration, bilateral: Secondary | ICD-10-CM

## 2022-06-16 DIAGNOSIS — H353122 Nonexudative age-related macular degeneration, left eye, intermediate dry stage: Secondary | ICD-10-CM

## 2022-06-16 DIAGNOSIS — H353211 Exudative age-related macular degeneration, right eye, with active choroidal neovascularization: Secondary | ICD-10-CM | POA: Diagnosis not present

## 2022-06-22 ENCOUNTER — Ambulatory Visit
Admission: RE | Admit: 2022-06-22 | Discharge: 2022-06-22 | Disposition: A | Discharge status: Home / Self Care | Payer: Medicare Other | Source: Ambulatory Visit | Attending: Internal Medicine | Admitting: Internal Medicine

## 2022-06-22 DIAGNOSIS — F17210 Nicotine dependence, cigarettes, uncomplicated: Secondary | ICD-10-CM

## 2022-07-10 DIAGNOSIS — R899 Unspecified abnormal finding in specimens from other organs, systems and tissues: Secondary | ICD-10-CM | POA: Diagnosis not present

## 2022-07-10 DIAGNOSIS — R7989 Other specified abnormal findings of blood chemistry: Secondary | ICD-10-CM | POA: Diagnosis not present

## 2022-07-25 ENCOUNTER — Encounter (INDEPENDENT_AMBULATORY_CARE_PROVIDER_SITE_OTHER): Payer: Medicare Other | Admitting: Ophthalmology

## 2022-07-25 DIAGNOSIS — H35033 Hypertensive retinopathy, bilateral: Secondary | ICD-10-CM | POA: Diagnosis not present

## 2022-07-25 DIAGNOSIS — I10 Essential (primary) hypertension: Secondary | ICD-10-CM

## 2022-07-25 DIAGNOSIS — H353121 Nonexudative age-related macular degeneration, left eye, early dry stage: Secondary | ICD-10-CM

## 2022-07-25 DIAGNOSIS — H353211 Exudative age-related macular degeneration, right eye, with active choroidal neovascularization: Secondary | ICD-10-CM

## 2022-07-25 DIAGNOSIS — E113292 Type 2 diabetes mellitus with mild nonproliferative diabetic retinopathy without macular edema, left eye: Secondary | ICD-10-CM

## 2022-07-25 DIAGNOSIS — H43813 Vitreous degeneration, bilateral: Secondary | ICD-10-CM | POA: Diagnosis not present

## 2022-07-28 ENCOUNTER — Ambulatory Visit: Payer: Medicare Other | Attending: Cardiology | Admitting: Cardiology

## 2022-07-28 ENCOUNTER — Encounter: Payer: Self-pay | Admitting: Cardiology

## 2022-07-28 VITALS — BP 112/68 | HR 95 | Ht 68.0 in | Wt 145.0 lb

## 2022-07-28 DIAGNOSIS — I700 Atherosclerosis of aorta: Secondary | ICD-10-CM

## 2022-07-28 DIAGNOSIS — R011 Cardiac murmur, unspecified: Secondary | ICD-10-CM

## 2022-07-28 DIAGNOSIS — R0602 Shortness of breath: Secondary | ICD-10-CM | POA: Diagnosis not present

## 2022-07-28 DIAGNOSIS — I10 Essential (primary) hypertension: Secondary | ICD-10-CM

## 2022-07-28 DIAGNOSIS — I25119 Atherosclerotic heart disease of native coronary artery with unspecified angina pectoris: Secondary | ICD-10-CM

## 2022-07-28 DIAGNOSIS — E7800 Pure hypercholesterolemia, unspecified: Secondary | ICD-10-CM

## 2022-07-28 NOTE — Progress Notes (Signed)
Cardiology Office Note:    Date:  07/28/2022   ID:  George Parrish, DOB Aug 08, 1945, MRN [id number]  PCP:  George Parrish Providers Cardiologist:  None     Referring MD: George Parrish, *    History of Present Illness:    George Parrish is a 77 y.o. male here for the evaluation of coronary artery disease/atherosclerosis at the request of George Parrish.  Had a lung cancer screening which showed diffuse CAD/aortic atherosclerosis.  Personally reviewed and interpreted.  Mild SOB with exertion, garden.   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 M - DM  Smoker.  Hypertension.  Nondiabetic.  Past Medical History:  Diagnosis Date   Arthritis    Asthma    Headache(784.0)    Hypertension     Past Surgical History:  Procedure Laterality Date   COLONOSCOPY WITH PROPOFOL N/A 03/03/2014   Procedure: COLONOSCOPY WITH PROPOFOL;  Surgeon: George Fair, MD;  Location: [facility];  Service: Endoscopy;  Laterality: N/A;   FINGER AMPUTATION  4-5 yrs ago    Current Medications: Current Meds  Medication Sig   albuterol (PROVENTIL HFA;VENTOLIN HFA) 108 (90 BASE) MCG/ACT inhaler Inhale 1 puff into the lungs every 6 (six) hours as needed for wheezing or shortness of breath.   amLODipine (NORVASC) 5 MG tablet Take 5 mg by mouth every evening.   buPROPion (WELLBUTRIN XL) 150 MG 24 hr tablet Take 150 mg by mouth every morning.   cholecalciferol (VITAMIN D) 1000 UNITS tablet Take 1,000 Units by mouth daily.   eplerenone (INSPRA) 50 MG tablet Take 50 mg by mouth 2 (two) times daily.   losartan-hydrochlorothiazide (HYZAAR) 100-25 MG per tablet Take 0.5 tablets by mouth every morning.   pantoprazole (PROTONIX) 40 MG tablet Take 40 mg by mouth every morning.   rosuvastatin (CRESTOR) 10 MG tablet Take 10 mg by mouth at bedtime.   [DISCONTINUED] hydrochlorothiazide (HYDRODIURIL) 25  MG tablet 12.5 mg daily.     Allergies:   Patient has no known allergies.   Social History   Socioeconomic History   Marital status: Married    Spouse name: Not on file   Number of children: Not on file   Years of education: Not on file   Highest education level: Not on file  Occupational History   Not on file  Tobacco Use   Smoking status: Every Day    Packs/day: 1.00    Years: 40.00    Total pack years: 40.00    Types: Cigarettes   Smokeless tobacco: Never   Tobacco comments:    Down to 1/3 pack per day on chantix  Substance and Sexual Activity   Alcohol use: Yes    Alcohol/week: 0.0 standard drinks of alcohol    Comment: beer 1-2 cans per day   Drug use: No   Sexual activity: Not on file  Other Topics Concern   Not on file  Social History Narrative   Not on file   Social Determinants of Health   Financial Resource Strain: Not on file  Food Insecurity: Not on file  Transportation Needs: Not on file  Physical Activity: Not on file  Stress: Not on file  Social Connections: Not on file     Family History: The patient's family history is not on file.  ROS:   Please see the history of present illness.    No  fevers chills nausea vomiting syncope bleeding all other systems reviewed and are negative.  EKGs/Labs/Other Studies Reviewed:    The following studies were reviewed today:  CT chest lung cancer screening 06/22/22: There is aortic atherosclerosis, as well as atherosclerosis of the great vessels of the mediastinum and the coronary arteries, including calcified atherosclerotic plaque in the left main, left anterior descending, left circumflex and right coronary arteries.  EKG:  The ekg ordered today demonstrates SR 95 RBBB, bi atrial enlargement.   Recent Labs: 12/07/2021: ALT 23 04/27/2022: BUN 31; Creatinine 1.62; Hemoglobin 11.0; Platelet Count 270; Potassium 5.4; Sodium 131  Recent Lipid Panel No results found for: "CHOL", "TRIG", "HDL", "CHOLHDL",  "VLDL", "LDLCALC", "LDLDIRECT"   Risk Assessment/Calculations:               Physical Exam:    VS:  BP 112/68   Pulse 95   Ht '5\' 8"'$  (1.727 m)   Wt 145 lb (65.8 kg)   SpO2 98%   BMI 22.05 kg/m     Wt Readings from Last 3 Encounters:  07/28/22 145 lb (65.8 kg)  04/27/22 149 lb 9.6 oz (67.9 kg)  01/25/22 148 lb 9.6 oz (67.4 kg)     GEN:  Well nourished, well developed in no acute distress HEENT: Normal NECK: No JVD; No carotid bruits LYMPHATICS: No lymphadenopathy CARDIAC: RRR, 1/6 SM apex, no rubs, gallops RESPIRATORY:  Clear to auscultation without rales, wheezing or rhonchi  ABDOMEN: Soft, non-tender, non-distended MUSCULOSKELETAL:  No edema; No deformity  SKIN: Warm and dry NEUROLOGIC:  Alert and oriented x 3 PSYCHIATRIC:  Normal affect   ASSESSMENT:    1. Coronary artery disease involving native coronary artery of native heart with angina pectoris ([place])   2. Pure hypercholesterolemia   3. Primary hypertension   4. Aortic atherosclerosis ([place])   5. Shortness of breath   6. Murmur, cardiac    PLAN:    In order of problems listed above:  Coronary artery disease/atherosclerosis/aortic atherosclerosis We will go and proceed with cardiac PET stress to look for any evidence of flow-limiting coronary disease with coronary calcification present. -Agree with Crestor.  LDL 120, goal < 70. Plaque stabilization keep.  Aspirin 81 mg will be helpful as well for secondary prevention. -Showed him images from his CT scan.  Quite diffuse coronary artery atherosclerosis.  Right bundle branch block - Noted on ECG.  No syncope.  Heart murmur - We will check an echocardiogram.  Could have mild mitral regurgitation.  Hypertension - Agree with current drug regimen.  Well-controlled.     Shared Decision Making/Informed Consent The risks [chest pain, shortness of breath, cardiac arrhythmias, dizziness, blood pressure fluctuations, myocardial infarction, stroke/transient  ischemic attack, nausea, vomiting, allergic reaction, radiation exposure, metallic taste sensation and life-threatening complications (estimated to be 1 in 10,000)], benefits (risk stratification, diagnosing coronary artery disease, treatment guidance) and alternatives of a cardiac PET stress test were discussed in detail with George Parrish and he agrees to proceed.    Medication Adjustments/Labs and Tests Ordered: Current medicines are reviewed at length with the patient today.  Concerns regarding medicines are outlined above.  Orders Placed This Encounter  Procedures   NM PET CT CARDIAC PERFUSION MULTI W/ABSOLUTE BLOODFLOW   EKG 12-Lead   ECHOCARDIOGRAM COMPLETE   No orders of the defined types were placed in this encounter.   Patient Instructions  Medication Instructions:  The current medical regimen is effective;  continue present plan and medications.  *If you need a  refill on your cardiac medications before your next appointment, please call your pharmacy*  Lab Work: None today If you have labs (blood work) drawn today and your tests are completely normal, you will receive your results only by: [place] (if you have MyChart) OR A paper copy in the mail If you have any lab test that is abnormal or we need to change your treatment, we will call you to review the results.   Testing/Procedures: Your physician has requested that you have an echocardiogram. Echocardiography is a painless test that uses sound waves to create images of your heart. It provides your doctor with information about the size and shape of your heart and how well your heart's chambers and valves are working. This procedure takes approximately one hour. There are no restrictions for this procedure. Please do NOT wear cologne, perfume, aftershave, or lotions (deodorant is allowed). Please arrive 15 minutes prior to your appointment time.    How to Prepare for Your Cardiac PET/CT Stress Test:  1. Please  do not take these medications before your test:   Medications that may interfere with the cardiac pharmacological stress agent (ex. nitrates - including erectile dysfunction medications, isosorbide mononitrate or beta-blockers) the day of the exam. (Erectile dysfunction medication should be held for at least 72 hrs prior to test) Theophylline containing medications for 12 hours. Dipyridamole 48 hours prior to the test. Your remaining medications may be taken with water.  2. Nothing to eat or drink, except water, 3 hours prior to arrival time.   NO caffeine/decaffeinated products, or chocolate 12 hours prior to arrival.  3. NO perfume, cologne or lotion  4. Total time is 1 to 2 hours; you may want to bring reading material for the waiting time.  5. Please report to Admitting at the [facility] Main Entrance 30 minutes early for your test.  [place], [place] [address]  Diabetic Preparation:  Hold oral medications. You may take NPH and Lantus insulin. Do not take Humalog or Humulin R (Regular Insulin) the day of your test. Check blood sugars prior to leaving the house. If able to eat breakfast prior to 3 hour fasting, you may take all medications, including your insulin, Do not worry if you miss your breakfast dose of insulin - start at your next meal.  IF YOU THINK YOU MAY BE PREGNANT, OR ARE NURSING PLEASE INFORM THE TECHNOLOGIST.  In preparation for your appointment, medication and supplies will be purchased.  Appointment availability is limited, so if you need to cancel or reschedule, please call the Radiology Department at [phone]  24 hours in advance to avoid a cancellation fee of $100.00  What to Expect After you Arrive:  Once you arrive and check in for your appointment, you will be taken to a preparation room within the Radiology Department.  A technologist or Nurse will obtain your medical history, verify that you are correctly prepped for the  exam, and explain the procedure.  Afterwards,  an IV will be started in your arm and electrodes will be placed on your skin for EKG monitoring during the stress portion of the exam. Then you will be escorted to the PET/CT scanner.  There, staff will get you positioned on the scanner and obtain a blood pressure and EKG.  During the exam, you will continue to be connected to the EKG and blood pressure machines.  A small, safe amount of a radioactive tracer will be injected in your IV  to obtain a series of pictures of your heart along with an injection of a stress agent.    After your Exam:  It is recommended that you eat a meal and drink a caffeinated beverage to counter act any effects of the stress agent.  Drink plenty of fluids for the remainder of the day and urinate frequently for the first couple of hours after the exam.  Your doctor will inform you of your test results within 7-10 business days.  For questions about your test or how to prepare for your test, please call: Marchia Bond, Cardiac Imaging Nurse Navigator  Gordy Clement, Cardiac Imaging Nurse Navigator Office: [phone]   Follow-Up: At [facility], you and your health needs are our priority.  As part of our continuing mission to provide you with exceptional heart care, we have created designated Provider Care Teams.  These Care Teams include your primary Cardiologist (physician) and Advanced Practice Providers (APPs -  Physician Assistants and Nurse Practitioners) who all work together to provide you with the care you need, when you need it.  We recommend signing up for the patient portal called "MyChart".  Sign up information is provided on this After Visit Summary.  MyChart is used to connect with patients for Virtual Visits (Telemedicine).  Patients are able to view lab/test results, encounter notes, upcoming appointments, etc.  Non-urgent messages can be sent to your provider as well.   To learn more about what you  can do with MyChart, go to [email or web address].    Your next appointment:   1 year(s)  Provider:   Dr Candee Furbish        Signed, Candee Furbish, MD  07/28/2022 10:25 AM    [place]

## 2022-07-28 NOTE — Patient Instructions (Signed)
Medication Instructions:  The current medical regimen is effective;  continue present plan and medications.  *If you need a refill on your cardiac medications before your next appointment, please call your pharmacy*  Lab Work: None today If you have labs (blood work) drawn today and your tests are completely normal, you will receive your results only by: [place] (if you have MyChart) OR A paper copy in the mail If you have any lab test that is abnormal or we need to change your treatment, we will call you to review the results.   Testing/Procedures: Your physician has requested that you have an echocardiogram. Echocardiography is a painless test that uses sound waves to create images of your heart. It provides your doctor with information about the size and shape of your heart and how well your heart's chambers and valves are working. This procedure takes approximately one hour. There are no restrictions for this procedure. Please do NOT wear cologne, perfume, aftershave, or lotions (deodorant is allowed). Please arrive 15 minutes prior to your appointment time.    How to Prepare for Your Cardiac PET/CT Stress Test:  1. Please do not take these medications before your test:   Medications that may interfere with the cardiac pharmacological stress agent (ex. nitrates - including erectile dysfunction medications, isosorbide mononitrate or beta-blockers) the day of the exam. (Erectile dysfunction medication should be held for at least 72 hrs prior to test) Theophylline containing medications for 12 hours. Dipyridamole 48 hours prior to the test. Your remaining medications may be taken with water.  2. Nothing to eat or drink, except water, 3 hours prior to arrival time.   NO caffeine/decaffeinated products, or chocolate 12 hours prior to arrival.  3. NO perfume, cologne or lotion  4. Total time is 1 to 2 hours; you may want to bring reading material for the waiting time.  5.  Please report to Admitting at the [facility] Main Entrance 30 minutes early for your test.  [place], [place] [address]  Diabetic Preparation:  Hold oral medications. You may take NPH and Lantus insulin. Do not take Humalog or Humulin R (Regular Insulin) the day of your test. Check blood sugars prior to leaving the house. If able to eat breakfast prior to 3 hour fasting, you may take all medications, including your insulin, Do not worry if you miss your breakfast dose of insulin - start at your next meal.  IF YOU THINK YOU MAY BE PREGNANT, OR ARE NURSING PLEASE INFORM THE TECHNOLOGIST.  In preparation for your appointment, medication and supplies will be purchased.  Appointment availability is limited, so if you need to cancel or reschedule, please call the Radiology Department at [phone]  24 hours in advance to avoid a cancellation fee of $100.00  What to Expect After you Arrive:  Once you arrive and check in for your appointment, you will be taken to a preparation room within the Radiology Department.  A technologist or Nurse will obtain your medical history, verify that you are correctly prepped for the exam, and explain the procedure.  Afterwards,  an IV will be started in your arm and electrodes will be placed on your skin for EKG monitoring during the stress portion of the exam. Then you will be escorted to the PET/CT scanner.  There, staff will get you positioned on the scanner and obtain a blood pressure and EKG.  During the exam, you will continue to be connected to the EKG  and blood pressure machines.  A small, safe amount of a radioactive tracer will be injected in your IV to obtain a series of pictures of your heart along with an injection of a stress agent.    After your Exam:  It is recommended that you eat a meal and drink a caffeinated beverage to counter act any effects of the stress agent.  Drink plenty of fluids for the remainder of the day and  urinate frequently for the first couple of hours after the exam.  Your doctor will inform you of your test results within 7-10 business days.  For questions about your test or how to prepare for your test, please call: Marchia Bond, Cardiac Imaging Nurse Navigator  Gordy Clement, Cardiac Imaging Nurse Navigator Office: [phone]   Follow-Up: At [facility], you and your health needs are our priority.  As part of our continuing mission to provide you with exceptional heart care, we have created designated Provider Care Teams.  These Care Teams include your primary Cardiologist (physician) and Advanced Practice Providers (APPs -  Physician Assistants and Nurse Practitioners) who all work together to provide you with the care you need, when you need it.  We recommend signing up for the patient portal called "MyChart".  Sign up information is provided on this After Visit Summary.  MyChart is used to connect with patients for Virtual Visits (Telemedicine).  Patients are able to view lab/test results, encounter notes, upcoming appointments, etc.  Non-urgent messages can be sent to your provider as well.   To learn more about what you can do with MyChart, go to [email or web address].    Your next appointment:   1 year(s)  Provider:   Dr Candee Furbish

## 2022-08-10 DIAGNOSIS — R899 Unspecified abnormal finding in specimens from other organs, systems and tissues: Secondary | ICD-10-CM | POA: Diagnosis not present

## 2022-08-10 DIAGNOSIS — E785 Hyperlipidemia, unspecified: Secondary | ICD-10-CM | POA: Diagnosis not present

## 2022-08-15 DIAGNOSIS — B3781 Candidal esophagitis: Secondary | ICD-10-CM | POA: Diagnosis not present

## 2022-08-15 DIAGNOSIS — K209 Esophagitis, unspecified without bleeding: Secondary | ICD-10-CM | POA: Diagnosis not present

## 2022-08-15 DIAGNOSIS — K319 Disease of stomach and duodenum, unspecified: Secondary | ICD-10-CM | POA: Diagnosis not present

## 2022-08-15 DIAGNOSIS — K2289 Other specified disease of esophagus: Secondary | ICD-10-CM | POA: Diagnosis not present

## 2022-08-15 DIAGNOSIS — K254 Chronic or unspecified gastric ulcer with hemorrhage: Secondary | ICD-10-CM | POA: Diagnosis not present

## 2022-08-18 ENCOUNTER — Telehealth (HOSPITAL_COMMUNITY): Payer: Self-pay | Admitting: Emergency Medicine

## 2022-08-18 ENCOUNTER — Telehealth (HOSPITAL_COMMUNITY): Payer: Self-pay | Admitting: *Deleted

## 2022-08-18 NOTE — Telephone Encounter (Signed)
Attempted to call patient regarding upcoming cardiac PET appointment. Left message on voicemail with name and callback number Marchia Bond RN Navigator Cardiac [facility] [phone] Office [phone] Cell

## 2022-08-18 NOTE — Telephone Encounter (Signed)
Patient returning call about his upcoming cardiac imaging study; pt verbalizes understanding of appt date/time, parking situation and where to check in, pre-test NPO status, and verified current allergies; name and call back number provided for further questions should they arise  Gordy Clement RN Navigator Cardiac Imaging [facility] [phone] office [phone] cell  Patient aware to avoid caffeine 12 hours prior to his cardiac PET scan.

## 2022-08-21 DIAGNOSIS — K2289 Other specified disease of esophagus: Secondary | ICD-10-CM | POA: Diagnosis not present

## 2022-08-21 DIAGNOSIS — B3781 Candidal esophagitis: Secondary | ICD-10-CM | POA: Diagnosis not present

## 2022-08-21 DIAGNOSIS — K319 Disease of stomach and duodenum, unspecified: Secondary | ICD-10-CM | POA: Diagnosis not present

## 2022-08-22 ENCOUNTER — Encounter (HOSPITAL_COMMUNITY)
Admission: RE | Admit: 2022-08-22 | Discharge: 2022-08-22 | Disposition: A | Discharge status: Home / Self Care | Payer: Medicare Other | Source: Ambulatory Visit | Attending: Cardiology | Admitting: Cardiology

## 2022-08-22 DIAGNOSIS — R0602 Shortness of breath: Secondary | ICD-10-CM | POA: Diagnosis not present

## 2022-08-22 DIAGNOSIS — I25119 Atherosclerotic heart disease of native coronary artery with unspecified angina pectoris: Secondary | ICD-10-CM | POA: Diagnosis not present

## 2022-08-22 LAB — NM PET CT CARDIAC PERFUSION MULTI W/ABSOLUTE BLOODFLOW
LV dias vol: 59 mL (ref 62–150)
LV sys vol: 21 mL
MBFR: 2.02
Nuc Rest EF: 64 %
Nuc Stress EF: 72 %
Rest MBF: 1.27 ml/g/min
Rest Nuclear Isotope Dose: 17.1 mCi
ST Depression (mm): 0 mm
Stress MBF: 2.57 ml/g/min
Stress Nuclear Isotope Dose: 17.1 mCi

## 2022-08-22 MED ORDER — REGADENOSON 0.4 MG/5ML IV SOLN
INTRAVENOUS | Status: AC
  Administered 2022-08-22: 0.4 mg via INTRAVENOUS
  Filled 2022-08-22: qty 5

## 2022-08-22 MED ORDER — DEXTROSE 5 % IV SOLN
INTRAVENOUS | Status: AC
  Filled 2022-08-22: qty 50

## 2022-08-22 MED ORDER — RUBIDIUM RB82 GENERATOR (RUBYFILL)
17.1000 | PACK | Freq: Once | INTRAVENOUS | Status: AC
  Administered 2022-08-22: 17.1 via INTRAVENOUS

## 2022-08-22 MED ORDER — CAFFEINE CITRATE BASE COMPONENT 10 MG/ML IV SOLN
INTRAVENOUS | Status: AC
  Filled 2022-08-22: qty 3

## 2022-08-22 MED ORDER — REGADENOSON 0.4 MG/5ML IV SOLN: 0.4000 mg | Freq: Once | INTRAVENOUS | Status: AC

## 2022-08-22 NOTE — Progress Notes (Signed)
Tolerated stress test well

## 2022-08-23 ENCOUNTER — Ambulatory Visit (HOSPITAL_COMMUNITY): Payer: Medicare Other | Attending: Cardiology

## 2022-08-23 DIAGNOSIS — R011 Cardiac murmur, unspecified: Secondary | ICD-10-CM

## 2022-08-23 DIAGNOSIS — R0602 Shortness of breath: Secondary | ICD-10-CM | POA: Diagnosis not present

## 2022-08-23 LAB — ECHOCARDIOGRAM COMPLETE
Area-P 1/2: 6.48 cm2
S' Lateral: 2.6 cm

## 2022-08-25 ENCOUNTER — Inpatient Hospital Stay: Payer: Medicare Other

## 2022-08-25 ENCOUNTER — Inpatient Hospital Stay: Payer: Medicare Other | Attending: Oncology | Admitting: Oncology

## 2022-08-25 VITALS — BP 113/76 | HR 87 | Temp 98.1°F | Resp 20 | Ht 68.0 in | Wt 145.0 lb

## 2022-08-25 DIAGNOSIS — D72822 Plasmacytosis: Secondary | ICD-10-CM | POA: Diagnosis not present

## 2022-08-25 DIAGNOSIS — Z23 Encounter for immunization: Secondary | ICD-10-CM

## 2022-08-25 DIAGNOSIS — J449 Chronic obstructive pulmonary disease, unspecified: Secondary | ICD-10-CM | POA: Diagnosis not present

## 2022-08-25 DIAGNOSIS — N289 Disorder of kidney and ureter, unspecified: Secondary | ICD-10-CM | POA: Insufficient documentation

## 2022-08-25 DIAGNOSIS — I10 Essential (primary) hypertension: Secondary | ICD-10-CM | POA: Diagnosis not present

## 2022-08-25 DIAGNOSIS — D649 Anemia, unspecified: Secondary | ICD-10-CM

## 2022-08-25 LAB — CBC WITH DIFFERENTIAL (CANCER CENTER ONLY)
Abs Immature Granulocytes: 0.02 10*3/uL (ref 0.00–0.07)
Basophils Absolute: 0 10*3/uL (ref 0.0–0.1)
Basophils Relative: 1 %
Eosinophils Absolute: 0.1 10*3/uL (ref 0.0–0.5)
Eosinophils Relative: 2 %
HCT: 32.1 % — ABNORMAL LOW (ref 39.0–52.0)
Hemoglobin: 11 g/dL — ABNORMAL LOW (ref 13.0–17.0)
Immature Granulocytes: 0 %
Lymphocytes Relative: 32 %
Lymphs Abs: 2.2 10*3/uL (ref 0.7–4.0)
MCH: 31.9 pg (ref 26.0–34.0)
MCHC: 34.3 g/dL (ref 30.0–36.0)
MCV: 93 fL (ref 80.0–100.0)
Monocytes Absolute: 0.7 10*3/uL (ref 0.1–1.0)
Monocytes Relative: 9 %
Neutro Abs: 3.9 10*3/uL (ref 1.7–7.7)
Neutrophils Relative %: 56 %
Platelet Count: 305 10*3/uL (ref 150–400)
RBC: 3.45 MIL/uL — ABNORMAL LOW (ref 4.22–5.81)
RDW: 12.2 % (ref 11.5–15.5)
WBC Count: 7 10*3/uL (ref 4.0–10.5)
nRBC: 0 % (ref 0.0–0.2)

## 2022-08-25 LAB — CMP (CANCER CENTER ONLY)
ALT: 22 U/L (ref 0–44)
AST: 22 U/L (ref 15–41)
Albumin: 4.3 g/dL (ref 3.5–5.0)
Alkaline Phosphatase: 80 U/L (ref 38–126)
Anion gap: 5 (ref 5–15)
BUN: 27 mg/dL — ABNORMAL HIGH (ref 8–23)
CO2: 28 mmol/L (ref 22–32)
Calcium: 9.6 mg/dL (ref 8.9–10.3)
Chloride: 101 mmol/L (ref 98–111)
Creatinine: 1.5 mg/dL — ABNORMAL HIGH (ref 0.61–1.24)
GFR, Estimated: 49 mL/min — ABNORMAL LOW (ref >60)
Glucose, Bld: 106 mg/dL — ABNORMAL HIGH (ref 70–99)
Potassium: 4.8 mmol/L (ref 3.5–5.1)
Sodium: 134 mmol/L — ABNORMAL LOW (ref 135–145)
Total Bilirubin: 0.3 mg/dL (ref 0.3–1.2)
Total Protein: 7.8 g/dL (ref 6.5–8.1)

## 2022-08-25 MED ORDER — INFLUENZA VAC A&B SA ADJ QUAD 0.5 ML IM PRSY
0.5000 mL | PREFILLED_SYRINGE | Freq: Once | INTRAMUSCULAR | Status: AC
  Administered 2022-08-25: 0.5 mL via INTRAMUSCULAR
  Filled 2022-08-25: qty 0.5

## 2022-08-25 NOTE — Progress Notes (Signed)
[facility] OFFICE PROGRESS NOTE   Diagnosis: Anemia, serum monoclonal protein  INTERVAL HISTORY:   Mr. Rand returns as scheduled.  He feels well.  Good appetite.  No difficulty with bowel function.  No complaint flank.  No bleeding.  He reports undergoing a camera endoscopy recently.  He reports "blood "was found at the GE junction.  He saw Dr. Alessandra Bevels.  Objective:  Vital signs in last 24 hours:  Blood pressure 113/76, pulse 87, temperature 98.1 F (36.7 C), temperature source Oral, resp. rate 20, height '5\' 8"'$  (1.727 m), weight 145 lb (65.8 kg), SpO2 100 %.    Lymphatics: No cervical, supraclavicular, right axillary, or inguinal nodes.  "Shotty "left axillary node Resp: Distant breath sounds, no respiratory distress Cardio: Regular rate and rhythm GI: No hepatosplenomegaly, nontender, no mass Vascular: No leg edema  Lab Results:  Lab Results  Component Value Date   WBC 7.0 08/25/2022   HGB 11.0 (L) 08/25/2022   HCT 32.1 (L) 08/25/2022   MCV 93.0 08/25/2022   PLT 305 08/25/2022   NEUTROABS 3.9 08/25/2022    CMP  Lab Results  Component Value Date   NA 131 (L) 04/27/2022   K 5.4 (H) 04/27/2022   CL 97 (L) 04/27/2022   CO2 25 04/27/2022   GLUCOSE 117 (H) 04/27/2022   BUN 31 (H) 04/27/2022   CREATININE 1.62 (H) 04/27/2022   CALCIUM 9.9 04/27/2022   PROT 7.7 12/07/2021   ALBUMIN 4.5 12/07/2021   AST 22 12/07/2021   ALT 23 12/07/2021   ALKPHOS 76 12/07/2021   BILITOT 0.5 12/07/2021   GFRNONAA 44 (L) 04/27/2022    No results found for: "CEA1", "CEA", "[id number]", "CA125"  No results found for: "INR", "LABPROT"  Imaging:  ECHOCARDIOGRAM COMPLETE  Result Date: 08/23/2022    ECHOCARDIOGRAM REPORT   Patient Name:   VEDANT MENDIAS Date of Exam: 08/23/2022 Medical Rec #:  [id number]        Height:       68.0 in Accession #:    [id number]       Weight:       145.0 lb Date of Birth:  May 19, 1946         BSA:          1.783 m Patient Age:    77 years          BP:           120/82 mmHg Patient Gender: M                HR:           91 bpm. Exam Location:  [facility] Procedure: 2D Echo, 3D Echo, Cardiac Doppler and Color Doppler Indications:    R01.1 Murmur  History:        Patient has no prior history of Echocardiogram examinations.                 Signs/Symptoms:Shortness of Breath; Risk Factors:Hypertension                 and HLD.  Sonographer:    Marygrace Drought RCS Referring Phys: [id number] [place]  1. Left ventricular ejection fraction, by estimation, is 55 to 60%. Left ventricular ejection fraction by 3D volume is 56 %. The left ventricle has normal function. The left ventricle has no regional wall motion abnormalities. Left ventricular diastolic  parameters are consistent with Grade I diastolic dysfunction (impaired relaxation).  2. Right ventricular systolic  function is normal. The right ventricular size is normal. There is normal pulmonary artery systolic pressure. The estimated right ventricular systolic pressure is [id number] mmHg.  3. Late systolic bowing without prolapse. The mitral valve is abnormal. Mild mitral valve regurgitation.  4. The aortic valve is tricuspid. Aortic valve regurgitation is trivial. Aortic valve sclerosis is present, with no evidence of aortic valve stenosis.  5. The inferior vena cava is normal in size with greater than 50% respiratory variability, suggesting right atrial pressure of 3 mmHg. Comparison(s): No prior Echocardiogram. FINDINGS  Left Ventricle: Left ventricular ejection fraction, by estimation, is 55 to 60%. Left ventricular ejection fraction by 3D volume is 56 %. The left ventricle has normal function. The left ventricle has no regional wall motion abnormalities. The left ventricular internal cavity size was normal in size. There is no left ventricular hypertrophy. Left ventricular diastolic parameters are consistent with Grade I diastolic dysfunction (impaired relaxation). Indeterminate filling pressures.  Right Ventricle: The right ventricular size is normal. No increase in right ventricular wall thickness. Right ventricular systolic function is normal. There is normal pulmonary artery systolic pressure. The tricuspid regurgitant velocity is 2.46 m/s, and  with an assumed right atrial pressure of 3 mmHg, the estimated right ventricular systolic pressure is [id number] mmHg. Left Atrium: Left atrial size was normal in size. Right Atrium: Right atrial size was normal in size. Pericardium: There is no evidence of pericardial effusion. Mitral Valve: Late systolic bowing without prolapse. The mitral valve is abnormal. Mild mitral valve regurgitation. Tricuspid Valve: The tricuspid valve is grossly normal. Tricuspid valve regurgitation is trivial. Aortic Valve: The aortic valve is tricuspid. Aortic valve regurgitation is trivial. Aortic valve sclerosis is present, with no evidence of aortic valve stenosis. Pulmonic Valve: The pulmonic valve was normal in structure. Pulmonic valve regurgitation is not visualized. Aorta: The aortic root and ascending aorta are structurally normal, with no evidence of dilitation. Venous: The inferior vena cava is normal in size with greater than 50% respiratory variability, suggesting right atrial pressure of 3 mmHg. IAS/Shunts: No atrial level shunt detected by color flow Doppler.  LEFT VENTRICLE PLAX 2D LVIDd:         3.70 cm         Diastology LVIDs:         2.60 cm         LV e' medial:    9.46 cm/s LV PW:         1.10 cm         LV E/e' medial:  8.0 LV IVS:        0.80 cm         LV e' lateral:   8.59 cm/s LVOT diam:     2.10 cm         LV E/e' lateral: 8.8 LV SV:         56 LV SV Index:   32 LVOT Area:     3.46 cm        3D Volume EF                                LV 3D EF:    Left  ventricul                                             ar                                             ejection                                             fraction                                              by 3D                                             volume is                                             56 %.                                 3D Volume EF:                                3D EF:        56 %                                LV EDV:       75 ml                                LV ESV:       33 ml                                LV SV:        42 ml RIGHT VENTRICLE RV Basal diam:  3.60 cm RV S prime:     12.90 cm/s TAPSE (M-mode): 2.5 cm RVSP:           27.2 mmHg LEFT ATRIUM             Index        RIGHT ATRIUM           Index LA diam:        2.00 cm 1.12 cm/m   RA Pressure: 3.00 mmHg LA Vol (A2C):   27.5 ml 15.43 ml/m  RA Area:     11.30 cm LA Vol (A4C):   24.9 ml 13.97 ml/m  RA Volume:   26.80 ml  15.03  ml/m LA Biplane Vol: 26.2 ml 14.70 ml/m  AORTIC VALVE LVOT Vmax:   103.00 cm/s LVOT Vmean:  68.700 cm/s LVOT VTI:    0.163 m  AORTA Ao Root diam: 3.70 cm Ao Asc diam:  3.20 cm MITRAL VALVE               TRICUSPID VALVE MV Area (PHT):             TR Peak grad:   24.2 mmHg MV Decel Time:             TR Vmax:        246.00 cm/s MV E velocity: 76.00 cm/s  Estimated RAP:  3.00 mmHg MV A velocity: 96.90 cm/s  RVSP:           27.2 mmHg MV E/A ratio:  0.78                            SHUNTS                            Systemic VTI:  0.16 m                            Systemic Diam: 2.10 cm Lyman Bishop MD Electronically signed by Lyman Bishop MD Signature Date/Time: 08/23/2022/10:46:00 AM    Final    NM PET CT CARDIAC PERFUSION MULTI W/ABSOLUTE BLOODFLOW  Result Date: 08/22/2022   LV perfusion is normal. There is no evidence of ischemia. There is no evidence of infarction.   Rest left ventricular function is normal. Rest EF: 64 %. Stress left ventricular function is normal. Stress EF: 72 %. End diastolic cavity size is normal. End systolic cavity size is normal.   Myocardial blood flow was computed to be 1.57m/g/min at rest and 2.512mg/min at stress. Global myocardial  blood flow reserve was 2.02 and was normal.   Coronary calcium was present on the attenuation correction CT images. Severe coronary calcifications were present. Coronary calcifications were present in the left anterior descending artery, left circumflex artery and right coronary artery distribution(s).   The study is normal. The study is low risk. CLINICAL DATA:  This over-read does not include interpretation of cardiac or coronary anatomy or pathology. The Cardiac PET CT interpretation by the cardiologist is attached. COMPARISON:  06/22/2022 chest CT. FINDINGS: Cardiovascular: Normal heart size. No significant pericardial effusion/thickening. Three-vessel coronary atherosclerosis. Atherosclerotic nonaneurysmal thoracic aorta. Normal caliber pulmonary arteries. Mediastinum/Nodes: Unremarkable esophagus. No pathologically enlarged mediastinal or hilar lymph nodes, noting limited sensitivity for the detection of hilar adenopathy on this noncontrast study. 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 Lungs/Pleura: No pneumothorax. No pleural effusion. No acute consolidative airspace disease, lung masses or significant pulmonary nodules. Diffuse bronchial wall thickening is chronic and unchanged. Upper abdomen: No acute abnormality. Musculoskeletal: No aggressive appearing focal osseous lesions. Moderate thoracic spondylosis. IMPRESSION: 1. 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 A very slow growing neoplasm such as thymoma cannot be excluded. Consider cardiothoracic surgical consultation and PET-CT for further evaluation, as clinically warranted. 2. Three-vessel coronary atherosclerosis. 3.  Aortic Atherosclerosis (ICD10-I70.0). Electronically Signed    By: Ilona Sorrel M.D.   On: 08/22/2022 09:01   Medications: I have reviewed the patient's current medications.   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  Renal insufficiency 4.  Hypertension 5.  COPD 6.  Bone marrow biopsy 01/20/2022-mild plasmacytosis, clonality not established 0.5 g serum M spike 12/07/2021 Serum immunofixation 12/07/2021-biclonal IgG lambda protein Normal immunoglobulin levels 12/07/2021 Mild elevation of kappa and lambda light chain 12/07/2021   Disposition: Mr. Everts has stable mild anemia.  We will follow-up on the myeloma panel from today.  There is no clinical evidence for progression to multiple myeloma or another lymphoproliferative disorder.  The anemia could be related to early myeloma or GI bleeding.  We will follow-up on the recent camera endoscopy.  Mr. Malhi received an influenza vaccine today.  He will return for an office and lab visit in 6 months.  Betsy Coder, MD  08/25/2022  8:54 AM

## 2022-08-28 LAB — KAPPA/LAMBDA LIGHT CHAINS
Kappa free light chain: 42.5 mg/L — ABNORMAL HIGH (ref 3.3–19.4)
Kappa, lambda light chain ratio: 1.11 (ref 0.26–1.65)
Lambda free light chains: 38.3 mg/L — ABNORMAL HIGH (ref 5.7–26.3)

## 2022-08-29 ENCOUNTER — Encounter (INDEPENDENT_AMBULATORY_CARE_PROVIDER_SITE_OTHER): Payer: Medicare Other | Admitting: Ophthalmology

## 2022-08-29 DIAGNOSIS — I10 Essential (primary) hypertension: Secondary | ICD-10-CM

## 2022-08-29 DIAGNOSIS — H353211 Exudative age-related macular degeneration, right eye, with active choroidal neovascularization: Secondary | ICD-10-CM

## 2022-08-29 DIAGNOSIS — H353121 Nonexudative age-related macular degeneration, left eye, early dry stage: Secondary | ICD-10-CM

## 2022-08-29 DIAGNOSIS — H35033 Hypertensive retinopathy, bilateral: Secondary | ICD-10-CM

## 2022-08-29 DIAGNOSIS — E113292 Type 2 diabetes mellitus with mild nonproliferative diabetic retinopathy without macular edema, left eye: Secondary | ICD-10-CM

## 2022-08-29 DIAGNOSIS — H43813 Vitreous degeneration, bilateral: Secondary | ICD-10-CM | POA: Diagnosis not present

## 2022-08-30 LAB — PROTEIN ELECTROPHORESIS, SERUM
A/G Ratio: 1.1 (ref 0.7–1.7)
Albumin ELP: 3.8 g/dL (ref 2.9–4.4)
Alpha-1-Globulin: 0.3 g/dL (ref 0.0–0.4)
Alpha-2-Globulin: 1 g/dL (ref 0.4–1.0)
Beta Globulin: 0.9 g/dL (ref 0.7–1.3)
Gamma Globulin: 1.4 g/dL (ref 0.4–1.8)
Globulin, Total: 3.5 g/dL (ref 2.2–3.9)
M-Spike, %: 0.5 g/dL — ABNORMAL HIGH
Total Protein ELP: 7.3 g/dL (ref 6.0–8.5)

## 2022-08-31 DIAGNOSIS — D649 Anemia, unspecified: Secondary | ICD-10-CM | POA: Diagnosis not present

## 2022-10-03 ENCOUNTER — Encounter (INDEPENDENT_AMBULATORY_CARE_PROVIDER_SITE_OTHER): Payer: Medicare Other | Admitting: Ophthalmology

## 2022-10-03 DIAGNOSIS — H43813 Vitreous degeneration, bilateral: Secondary | ICD-10-CM | POA: Diagnosis not present

## 2022-10-03 DIAGNOSIS — H35711 Central serous chorioretinopathy, right eye: Secondary | ICD-10-CM | POA: Diagnosis not present

## 2022-10-03 DIAGNOSIS — H35033 Hypertensive retinopathy, bilateral: Secondary | ICD-10-CM

## 2022-10-03 DIAGNOSIS — H353121 Nonexudative age-related macular degeneration, left eye, early dry stage: Secondary | ICD-10-CM | POA: Diagnosis not present

## 2022-10-03 DIAGNOSIS — E113293 Type 2 diabetes mellitus with mild nonproliferative diabetic retinopathy without macular edema, bilateral: Secondary | ICD-10-CM | POA: Diagnosis not present

## 2022-10-03 DIAGNOSIS — I10 Essential (primary) hypertension: Secondary | ICD-10-CM | POA: Diagnosis not present

## 2022-10-03 DIAGNOSIS — H353211 Exudative age-related macular degeneration, right eye, with active choroidal neovascularization: Secondary | ICD-10-CM

## 2022-11-07 ENCOUNTER — Encounter (INDEPENDENT_AMBULATORY_CARE_PROVIDER_SITE_OTHER): Payer: Medicare Other | Admitting: Ophthalmology

## 2022-11-07 DIAGNOSIS — H353121 Nonexudative age-related macular degeneration, left eye, early dry stage: Secondary | ICD-10-CM

## 2022-11-07 DIAGNOSIS — H353211 Exudative age-related macular degeneration, right eye, with active choroidal neovascularization: Secondary | ICD-10-CM

## 2022-11-07 DIAGNOSIS — H35033 Hypertensive retinopathy, bilateral: Secondary | ICD-10-CM

## 2022-11-07 DIAGNOSIS — E113393 Type 2 diabetes mellitus with moderate nonproliferative diabetic retinopathy without macular edema, bilateral: Secondary | ICD-10-CM | POA: Diagnosis not present

## 2022-11-07 DIAGNOSIS — I10 Essential (primary) hypertension: Secondary | ICD-10-CM

## 2022-11-07 DIAGNOSIS — H2512 Age-related nuclear cataract, left eye: Secondary | ICD-10-CM | POA: Diagnosis not present

## 2022-11-07 DIAGNOSIS — Z7984 Long term (current) use of oral hypoglycemic drugs: Secondary | ICD-10-CM

## 2022-11-07 DIAGNOSIS — H43813 Vitreous degeneration, bilateral: Secondary | ICD-10-CM | POA: Diagnosis not present

## 2022-12-19 ENCOUNTER — Encounter (INDEPENDENT_AMBULATORY_CARE_PROVIDER_SITE_OTHER): Payer: Medicare Other | Admitting: Ophthalmology

## 2022-12-19 DIAGNOSIS — I10 Essential (primary) hypertension: Secondary | ICD-10-CM

## 2022-12-19 DIAGNOSIS — H353121 Nonexudative age-related macular degeneration, left eye, early dry stage: Secondary | ICD-10-CM

## 2022-12-19 DIAGNOSIS — H35033 Hypertensive retinopathy, bilateral: Secondary | ICD-10-CM | POA: Diagnosis not present

## 2022-12-19 DIAGNOSIS — H353211 Exudative age-related macular degeneration, right eye, with active choroidal neovascularization: Secondary | ICD-10-CM | POA: Diagnosis not present

## 2022-12-19 DIAGNOSIS — H43813 Vitreous degeneration, bilateral: Secondary | ICD-10-CM

## 2022-12-19 DIAGNOSIS — Z7984 Long term (current) use of oral hypoglycemic drugs: Secondary | ICD-10-CM

## 2022-12-19 DIAGNOSIS — E113393 Type 2 diabetes mellitus with moderate nonproliferative diabetic retinopathy without macular edema, bilateral: Secondary | ICD-10-CM | POA: Diagnosis not present

## 2023-01-23 ENCOUNTER — Encounter (INDEPENDENT_AMBULATORY_CARE_PROVIDER_SITE_OTHER): Payer: Medicare Other | Admitting: Ophthalmology

## 2023-01-23 DIAGNOSIS — H35712 Central serous chorioretinopathy, left eye: Secondary | ICD-10-CM

## 2023-01-23 DIAGNOSIS — E113292 Type 2 diabetes mellitus with mild nonproliferative diabetic retinopathy without macular edema, left eye: Secondary | ICD-10-CM | POA: Diagnosis not present

## 2023-01-23 DIAGNOSIS — H43813 Vitreous degeneration, bilateral: Secondary | ICD-10-CM

## 2023-01-23 DIAGNOSIS — H353211 Exudative age-related macular degeneration, right eye, with active choroidal neovascularization: Secondary | ICD-10-CM

## 2023-01-23 DIAGNOSIS — Z7984 Long term (current) use of oral hypoglycemic drugs: Secondary | ICD-10-CM

## 2023-01-23 DIAGNOSIS — H2512 Age-related nuclear cataract, left eye: Secondary | ICD-10-CM

## 2023-01-23 DIAGNOSIS — H353121 Nonexudative age-related macular degeneration, left eye, early dry stage: Secondary | ICD-10-CM

## 2023-01-23 DIAGNOSIS — I10 Essential (primary) hypertension: Secondary | ICD-10-CM

## 2023-01-23 DIAGNOSIS — H35033 Hypertensive retinopathy, bilateral: Secondary | ICD-10-CM | POA: Diagnosis not present

## 2023-02-23 ENCOUNTER — Encounter: Payer: Self-pay | Admitting: *Deleted

## 2023-02-23 ENCOUNTER — Inpatient Hospital Stay: Payer: Medicare Other | Attending: Oncology

## 2023-02-23 ENCOUNTER — Inpatient Hospital Stay: Payer: Medicare Other | Admitting: Oncology

## 2023-02-23 ENCOUNTER — Other Ambulatory Visit: Payer: Self-pay | Admitting: *Deleted

## 2023-02-23 ENCOUNTER — Encounter: Payer: Self-pay | Admitting: Oncology

## 2023-02-23 VITALS — BP 134/80 | HR 87 | Temp 98.2°F | Resp 18 | Ht 68.0 in | Wt 152.5 lb

## 2023-02-23 DIAGNOSIS — I10 Essential (primary) hypertension: Secondary | ICD-10-CM | POA: Insufficient documentation

## 2023-02-23 DIAGNOSIS — N289 Disorder of kidney and ureter, unspecified: Secondary | ICD-10-CM | POA: Insufficient documentation

## 2023-02-23 DIAGNOSIS — Z86010 Personal history of colonic polyps: Secondary | ICD-10-CM | POA: Diagnosis not present

## 2023-02-23 DIAGNOSIS — D649 Anemia, unspecified: Secondary | ICD-10-CM

## 2023-02-23 DIAGNOSIS — R779 Abnormality of plasma protein, unspecified: Secondary | ICD-10-CM | POA: Diagnosis not present

## 2023-02-23 DIAGNOSIS — J449 Chronic obstructive pulmonary disease, unspecified: Secondary | ICD-10-CM | POA: Insufficient documentation

## 2023-02-23 LAB — CMP (CANCER CENTER ONLY)
ALT: 21 U/L (ref 0–44)
AST: 21 U/L (ref 15–41)
Albumin: 4.3 g/dL (ref 3.5–5.0)
Alkaline Phosphatase: 78 U/L (ref 38–126)
Anion gap: 8 (ref 5–15)
BUN: 33 mg/dL — ABNORMAL HIGH (ref 8–23)
CO2: 26 mmol/L (ref 22–32)
Calcium: 8.8 mg/dL — ABNORMAL LOW (ref 8.9–10.3)
Chloride: 98 mmol/L (ref 98–111)
Creatinine: 1.84 mg/dL — ABNORMAL HIGH (ref 0.61–1.24)
GFR, Estimated: 38 mL/min — ABNORMAL LOW (ref >60)
Glucose, Bld: 114 mg/dL — ABNORMAL HIGH (ref 70–99)
Potassium: 4.9 mmol/L (ref 3.5–5.1)
Sodium: 132 mmol/L — ABNORMAL LOW (ref 135–145)
Total Bilirubin: 0.4 mg/dL (ref 0.3–1.2)
Total Protein: 7.4 g/dL (ref 6.5–8.1)

## 2023-02-23 LAB — CBC WITH DIFFERENTIAL (CANCER CENTER ONLY)
Abs Immature Granulocytes: 0.03 10*3/uL (ref 0.00–0.07)
Basophils Absolute: 0 10*3/uL (ref 0.0–0.1)
Basophils Relative: 1 %
Eosinophils Absolute: 0.2 10*3/uL (ref 0.0–0.5)
Eosinophils Relative: 2 %
HCT: 30.4 % — ABNORMAL LOW (ref 39.0–52.0)
Hemoglobin: 10.2 g/dL — ABNORMAL LOW (ref 13.0–17.0)
Immature Granulocytes: 0 %
Lymphocytes Relative: 21 %
Lymphs Abs: 1.7 10*3/uL (ref 0.7–4.0)
MCH: 31.2 pg (ref 26.0–34.0)
MCHC: 33.6 g/dL (ref 30.0–36.0)
MCV: 93 fL (ref 80.0–100.0)
Monocytes Absolute: 0.9 10*3/uL (ref 0.1–1.0)
Monocytes Relative: 11 %
Neutro Abs: 5.2 10*3/uL (ref 1.7–7.7)
Neutrophils Relative %: 65 %
Platelet Count: 256 10*3/uL (ref 150–400)
RBC: 3.27 MIL/uL — ABNORMAL LOW (ref 4.22–5.81)
RDW: 13.3 % (ref 11.5–15.5)
WBC Count: 8 10*3/uL (ref 4.0–10.5)
nRBC: 0 % (ref 0.0–0.2)

## 2023-02-23 NOTE — Progress Notes (Signed)
  [facility] OFFICE PROGRESS NOTE   Diagnosis: Anemia, monoclonal protein  INTERVAL HISTORY:   George Parrish returns as scheduled.  He feels well.  No fever or night sweats.  Good energy level.  No bleeding.  He has mild exertional dyspnea.  Objective:  Vital signs in last 24 hours:  Blood pressure 134/80, pulse 87, temperature 98.2 F (36.8 C), temperature source Oral, resp. rate 18, height 5\' 8"  (1.727 m), weight 152 lb 8 oz (69.2 kg), SpO2 98%.   Lymphatics: No cervical, supraclavicular, axillary, or inguinal nodes Resp: Lungs with distant breath sounds, no respiratory distress Cardio: Regular rate and rhythm GI: No hepatosplenomegaly Vascular: No leg edema   Lab Results:  Lab Results  Component Value Date   WBC 8.0 02/23/2023   HGB 10.2 (L) 02/23/2023   HCT 30.4 (L) 02/23/2023   MCV 93.0 02/23/2023   PLT 256 02/23/2023   NEUTROABS 5.2 02/23/2023    CMP  Lab Results  Component Value Date   NA 134 (L) 08/25/2022   K 4.8 08/25/2022   CL 101 08/25/2022   CO2 28 08/25/2022   GLUCOSE 106 (H) 08/25/2022   BUN 27 (H) 08/25/2022   CREATININE 1.50 (H) 08/25/2022   CALCIUM 9.6 08/25/2022   PROT 7.8 08/25/2022   ALBUMIN 4.3 08/25/2022   AST 22 08/25/2022   ALT 22 08/25/2022   ALKPHOS 80 08/25/2022   BILITOT 0.3 08/25/2022   GFRNONAA 49 (L) 08/25/2022    Medications: I have reviewed the patient's current medications.   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  Renal insufficiency 4.  Hypertension 5.  COPD 6.  Bone marrow biopsy 01/20/2022-mild plasmacytosis, clonality not established 0.5 g serum M spike 12/07/2021 Serum immunofixation 12/07/2021-biclonal IgG lambda protein Normal immunoglobulin levels 12/07/2021 Mild elevation of kappa and lambda light chain 12/07/2021    Disposition: George Parrish appears stable.  The serum M spike was stable when he was here in February.  We will follow-up on the myeloma panel from today.  The hemoglobin is slightly lower today.  He will call for symptoms of anemia.  He will return for an office and lab visit in 4 months.  It is unclear whether he has a serum monoclonal protein of unknown significance versus early myeloma.  He will remain up-to-date on influenza and pneumonia vaccines.  Thornton Papas, MD  02/23/2023  8:26 AM

## 2023-02-26 LAB — KAPPA/LAMBDA LIGHT CHAINS
Kappa free light chain: 44.4 mg/L — ABNORMAL HIGH (ref 3.3–19.4)
Kappa, lambda light chain ratio: 1.12 (ref 0.26–1.65)
Lambda free light chains: 39.5 mg/L — ABNORMAL HIGH (ref 5.7–26.3)

## 2023-02-27 ENCOUNTER — Encounter (INDEPENDENT_AMBULATORY_CARE_PROVIDER_SITE_OTHER): Payer: Medicare Other | Admitting: Ophthalmology

## 2023-02-27 DIAGNOSIS — H353211 Exudative age-related macular degeneration, right eye, with active choroidal neovascularization: Secondary | ICD-10-CM

## 2023-02-27 DIAGNOSIS — Z7984 Long term (current) use of oral hypoglycemic drugs: Secondary | ICD-10-CM | POA: Diagnosis not present

## 2023-02-27 DIAGNOSIS — H35711 Central serous chorioretinopathy, right eye: Secondary | ICD-10-CM | POA: Diagnosis not present

## 2023-02-27 DIAGNOSIS — H35033 Hypertensive retinopathy, bilateral: Secondary | ICD-10-CM

## 2023-02-27 DIAGNOSIS — H43813 Vitreous degeneration, bilateral: Secondary | ICD-10-CM

## 2023-02-27 DIAGNOSIS — H353121 Nonexudative age-related macular degeneration, left eye, early dry stage: Secondary | ICD-10-CM

## 2023-02-27 DIAGNOSIS — E113292 Type 2 diabetes mellitus with mild nonproliferative diabetic retinopathy without macular edema, left eye: Secondary | ICD-10-CM

## 2023-02-27 DIAGNOSIS — I10 Essential (primary) hypertension: Secondary | ICD-10-CM | POA: Diagnosis not present

## 2023-02-27 LAB — PROTEIN ELECTROPHORESIS, SERUM
A/G Ratio: 1.2 (ref 0.7–1.7)
Albumin ELP: 3.9 g/dL (ref 2.9–4.4)
Alpha-1-Globulin: 0.2 g/dL (ref 0.0–0.4)
Alpha-2-Globulin: 0.8 g/dL (ref 0.4–1.0)
Beta Globulin: 0.9 g/dL (ref 0.7–1.3)
Gamma Globulin: 1.4 g/dL (ref 0.4–1.8)
Globulin, Total: 3.2 g/dL (ref 2.2–3.9)
M-Spike, %: 0.5 g/dL — ABNORMAL HIGH
Total Protein ELP: 7.1 g/dL (ref 6.0–8.5)

## 2023-02-28 ENCOUNTER — Encounter: Payer: Self-pay | Admitting: *Deleted

## 2023-03-09 ENCOUNTER — Inpatient Hospital Stay: Payer: Medicare Other | Attending: Oncology

## 2023-03-09 ENCOUNTER — Telehealth: Payer: Self-pay

## 2023-03-09 DIAGNOSIS — D649 Anemia, unspecified: Secondary | ICD-10-CM | POA: Diagnosis not present

## 2023-03-09 LAB — BASIC METABOLIC PANEL - CANCER CENTER ONLY
Anion gap: 8 (ref 5–15)
BUN: 35 mg/dL — ABNORMAL HIGH (ref 8–23)
CO2: 25 mmol/L (ref 22–32)
Calcium: 8.9 mg/dL (ref 8.9–10.3)
Chloride: 99 mmol/L (ref 98–111)
Creatinine: 1.72 mg/dL — ABNORMAL HIGH (ref 0.61–1.24)
GFR, Estimated: 41 mL/min — ABNORMAL LOW (ref >60)
Glucose, Bld: 118 mg/dL — ABNORMAL HIGH (ref 70–99)
Potassium: 4.8 mmol/L (ref 3.5–5.1)
Sodium: 132 mmol/L — ABNORMAL LOW (ref 135–145)

## 2023-03-09 NOTE — Telephone Encounter (Signed)
Patient gave verbal understanding and had no further questions or concerns  

## 2023-04-02 ENCOUNTER — Encounter (INDEPENDENT_AMBULATORY_CARE_PROVIDER_SITE_OTHER): Payer: Medicare Other | Admitting: Ophthalmology

## 2023-04-02 DIAGNOSIS — E113293 Type 2 diabetes mellitus with mild nonproliferative diabetic retinopathy without macular edema, bilateral: Secondary | ICD-10-CM

## 2023-04-02 DIAGNOSIS — H35713 Central serous chorioretinopathy, bilateral: Secondary | ICD-10-CM | POA: Diagnosis not present

## 2023-04-02 DIAGNOSIS — H35033 Hypertensive retinopathy, bilateral: Secondary | ICD-10-CM | POA: Diagnosis not present

## 2023-04-02 DIAGNOSIS — H353121 Nonexudative age-related macular degeneration, left eye, early dry stage: Secondary | ICD-10-CM

## 2023-04-02 DIAGNOSIS — Z7984 Long term (current) use of oral hypoglycemic drugs: Secondary | ICD-10-CM

## 2023-04-02 DIAGNOSIS — H353211 Exudative age-related macular degeneration, right eye, with active choroidal neovascularization: Secondary | ICD-10-CM | POA: Diagnosis not present

## 2023-04-02 DIAGNOSIS — H43813 Vitreous degeneration, bilateral: Secondary | ICD-10-CM

## 2023-04-02 DIAGNOSIS — I10 Essential (primary) hypertension: Secondary | ICD-10-CM

## 2023-05-14 ENCOUNTER — Encounter (INDEPENDENT_AMBULATORY_CARE_PROVIDER_SITE_OTHER): Payer: Medicare Other | Admitting: Ophthalmology

## 2023-05-14 DIAGNOSIS — H35033 Hypertensive retinopathy, bilateral: Secondary | ICD-10-CM | POA: Diagnosis not present

## 2023-05-14 DIAGNOSIS — E113293 Type 2 diabetes mellitus with mild nonproliferative diabetic retinopathy without macular edema, bilateral: Secondary | ICD-10-CM

## 2023-05-14 DIAGNOSIS — Z7984 Long term (current) use of oral hypoglycemic drugs: Secondary | ICD-10-CM

## 2023-05-14 DIAGNOSIS — I10 Essential (primary) hypertension: Secondary | ICD-10-CM | POA: Diagnosis not present

## 2023-05-14 DIAGNOSIS — H353211 Exudative age-related macular degeneration, right eye, with active choroidal neovascularization: Secondary | ICD-10-CM

## 2023-05-14 DIAGNOSIS — H43813 Vitreous degeneration, bilateral: Secondary | ICD-10-CM | POA: Diagnosis not present

## 2023-05-14 DIAGNOSIS — H353121 Nonexudative age-related macular degeneration, left eye, early dry stage: Secondary | ICD-10-CM

## 2023-05-14 DIAGNOSIS — H2512 Age-related nuclear cataract, left eye: Secondary | ICD-10-CM

## 2023-05-30 ENCOUNTER — Other Ambulatory Visit (HOSPITAL_COMMUNITY): Payer: Self-pay | Admitting: Internal Medicine

## 2023-05-30 ENCOUNTER — Other Ambulatory Visit (HOSPITAL_COMMUNITY): Payer: Self-pay | Admitting: Respiratory Therapy

## 2023-05-30 DIAGNOSIS — N183 Chronic kidney disease, stage 3 unspecified: Secondary | ICD-10-CM | POA: Diagnosis not present

## 2023-05-30 DIAGNOSIS — I10 Essential (primary) hypertension: Secondary | ICD-10-CM | POA: Diagnosis not present

## 2023-05-30 DIAGNOSIS — I700 Atherosclerosis of aorta: Secondary | ICD-10-CM | POA: Diagnosis not present

## 2023-05-30 DIAGNOSIS — R918 Other nonspecific abnormal finding of lung field: Secondary | ICD-10-CM | POA: Diagnosis not present

## 2023-05-30 DIAGNOSIS — Z0000 Encounter for general adult medical examination without abnormal findings: Secondary | ICD-10-CM | POA: Diagnosis not present

## 2023-05-30 DIAGNOSIS — D126 Benign neoplasm of colon, unspecified: Secondary | ICD-10-CM | POA: Diagnosis not present

## 2023-05-30 DIAGNOSIS — D472 Monoclonal gammopathy: Secondary | ICD-10-CM | POA: Diagnosis not present

## 2023-05-30 DIAGNOSIS — J449 Chronic obstructive pulmonary disease, unspecified: Secondary | ICD-10-CM

## 2023-05-30 DIAGNOSIS — D649 Anemia, unspecified: Secondary | ICD-10-CM | POA: Diagnosis not present

## 2023-05-30 DIAGNOSIS — K9089 Other intestinal malabsorption: Secondary | ICD-10-CM | POA: Diagnosis not present

## 2023-05-30 DIAGNOSIS — F17210 Nicotine dependence, cigarettes, uncomplicated: Secondary | ICD-10-CM | POA: Diagnosis not present

## 2023-05-30 DIAGNOSIS — E559 Vitamin D deficiency, unspecified: Secondary | ICD-10-CM | POA: Diagnosis not present

## 2023-05-30 DIAGNOSIS — H3530 Unspecified macular degeneration: Secondary | ICD-10-CM | POA: Diagnosis not present

## 2023-05-30 DIAGNOSIS — I2510 Atherosclerotic heart disease of native coronary artery without angina pectoris: Secondary | ICD-10-CM | POA: Diagnosis not present

## 2023-06-13 ENCOUNTER — Ambulatory Visit (HOSPITAL_COMMUNITY)
Admission: RE | Admit: 2023-06-13 | Discharge: 2023-06-13 | Disposition: A | Discharge status: Home / Self Care | Payer: Medicare Other | Source: Ambulatory Visit | Attending: Internal Medicine | Admitting: Internal Medicine

## 2023-06-13 DIAGNOSIS — R918 Other nonspecific abnormal finding of lung field: Secondary | ICD-10-CM | POA: Insufficient documentation

## 2023-06-13 DIAGNOSIS — I700 Atherosclerosis of aorta: Secondary | ICD-10-CM | POA: Diagnosis not present

## 2023-06-13 DIAGNOSIS — J9859 Other diseases of mediastinum, not elsewhere classified: Secondary | ICD-10-CM | POA: Diagnosis not present

## 2023-06-15 ENCOUNTER — Ambulatory Visit (HOSPITAL_COMMUNITY)
Admission: RE | Admit: 2023-06-15 | Discharge: 2023-06-15 | Disposition: A | Discharge status: Home / Self Care | Payer: Medicare Other | Source: Ambulatory Visit | Attending: Internal Medicine | Admitting: Internal Medicine

## 2023-06-15 DIAGNOSIS — J449 Chronic obstructive pulmonary disease, unspecified: Secondary | ICD-10-CM | POA: Insufficient documentation

## 2023-06-15 LAB — PULMONARY FUNCTION TEST
DL/VA % pred: 64 %
DL/VA: 2.58 ml/min/mmHg/L
DLCO unc % pred: 48 %
DLCO unc: 11.47 ml/min/mmHg
FEF 25-75 Post: 0.43 L/s
FEF 25-75 Pre: 0.38 L/s
FEF2575-%Change-Post: 13 %
FEF2575-%Pred-Post: 20 %
FEF2575-%Pred-Pre: 18 %
FEV1-%Change-Post: 2 %
FEV1-%Pred-Post: 39 %
FEV1-%Pred-Pre: 38 %
FEV1-Post: 1.11 L
FEV1-Pre: 1.09 L
FEV1FVC-%Change-Post: -5 %
FEV1FVC-%Pred-Pre: 72 %
FEV6-%Change-Post: 4 %
FEV6-%Pred-Post: 54 %
FEV6-%Pred-Pre: 52 %
FEV6-Post: 2 L
FEV6-Pre: 1.91 L
FEV6FVC-%Change-Post: -3 %
FEV6FVC-%Pred-Post: 95 %
FEV6FVC-%Pred-Pre: 98 %
FVC-%Change-Post: 8 %
FVC-%Pred-Post: 57 %
FVC-%Pred-Pre: 52 %
FVC-Post: 2.24 L
FVC-Pre: 2.07 L
Post FEV1/FVC ratio: 50 %
Post FEV6/FVC ratio: 89 %
Pre FEV1/FVC ratio: 53 %
Pre FEV6/FVC Ratio: 92 %
RV % pred: 202 %
RV: 4.96 L
TLC % pred: 113 %
TLC: 7.54 L

## 2023-06-15 MED ORDER — ALBUTEROL SULFATE (2.5 MG/3ML) 0.083% IN NEBU
2.5000 mg | INHALATION_SOLUTION | Freq: Once | RESPIRATORY_TRACT | Status: AC
  Administered 2023-06-15: 2.5 mg via RESPIRATORY_TRACT

## 2023-06-19 IMAGING — CT CT CHEST LUNG CANCER SCREENING LOW DOSE W/O CM
2 series · 12 of 32 positions shown, 13 images · non-contrast
Comparison: Low-dose lung cancer screening chest CT 10/09/2019.

CLINICAL DATA: 75-year-old male current smoker with 45 pack-year
history of smoking. Lung cancer screening examination.

EXAM:
CT CHEST WITHOUT CONTRAST LOW-DOSE FOR LUNG CANCER SCREENING
TECHNIQUE: Multidetector CT imaging of the chest was performed following the
standard protocol without IV contrast.

[Series 2: ldct screening <30 bmi · axial · 0.77mm/px · z∈[-156,+29]mm · 4 of 75 slices shown, 5 images]
[im 19/75  mediastinal]
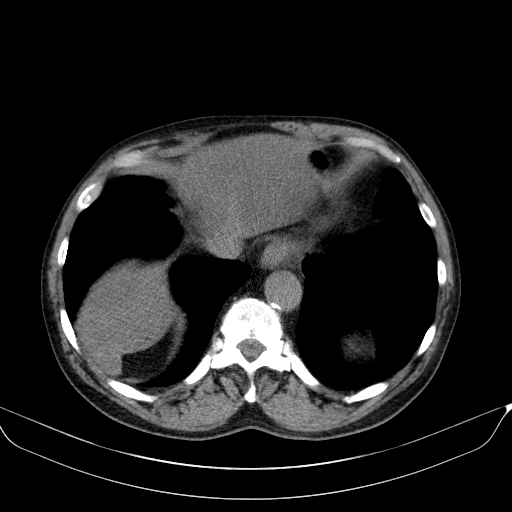
[im 19/75  lung]
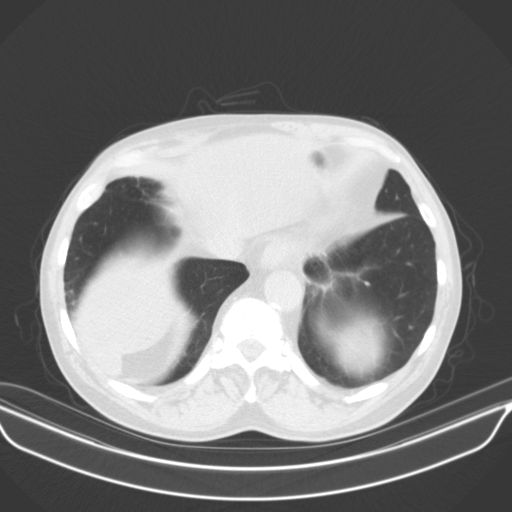
[im 36/75  lung]
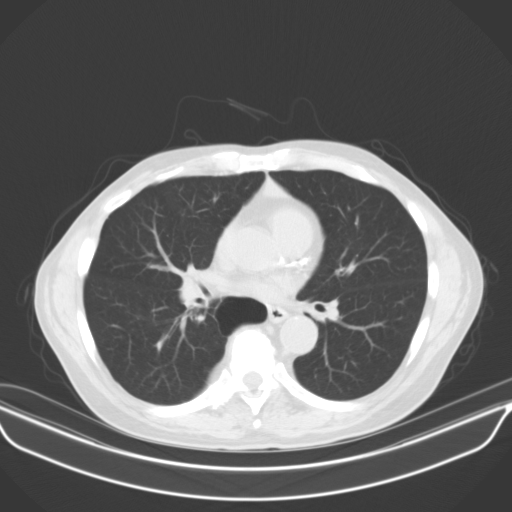
[im 38/75  lung]
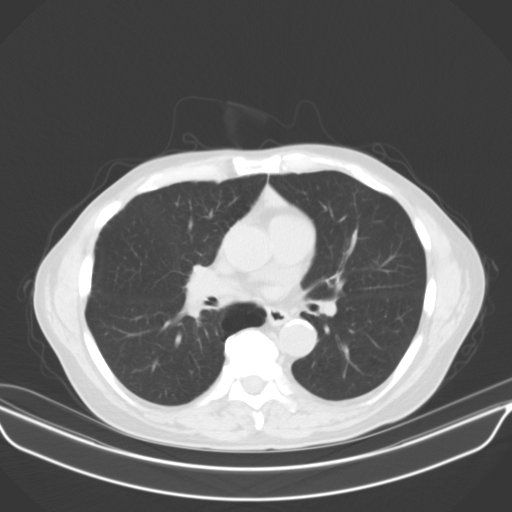
[im 56/75  lung]
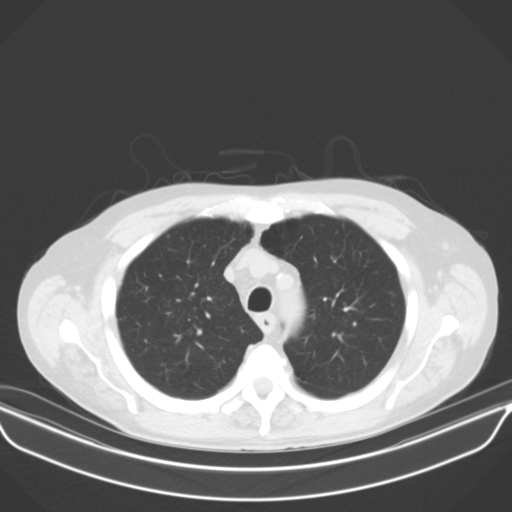

[Series 3: ldct screen lung · axial · 0.77mm/px · z∈[-171,+94]mm · 8 of 325 slices shown]
[im 30/325  lung]
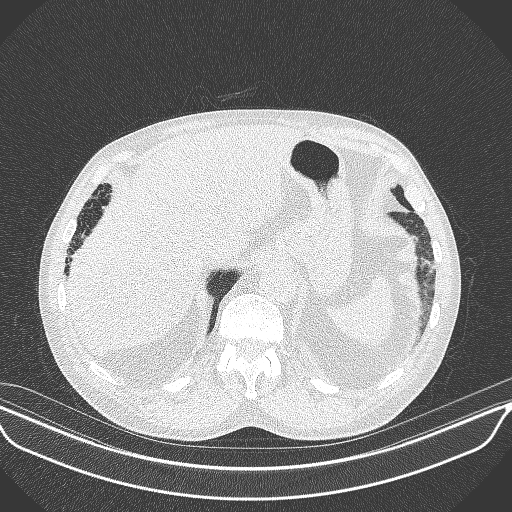
[im 74/325  lung]
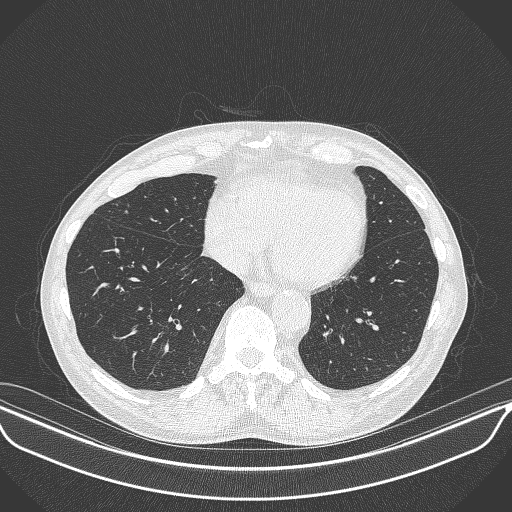
[im 104/325  lung]
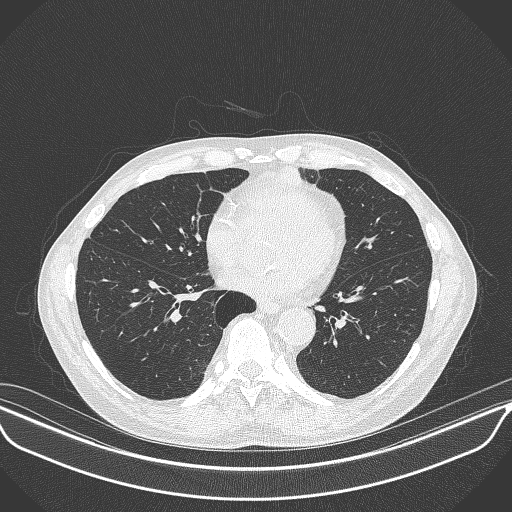
[im 133/325  lung]
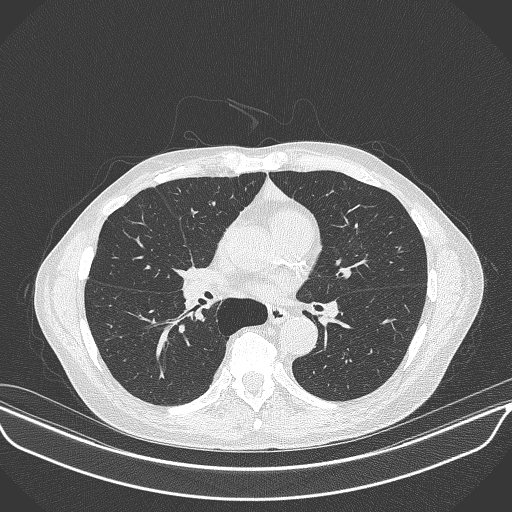
[im 177/325  lung]
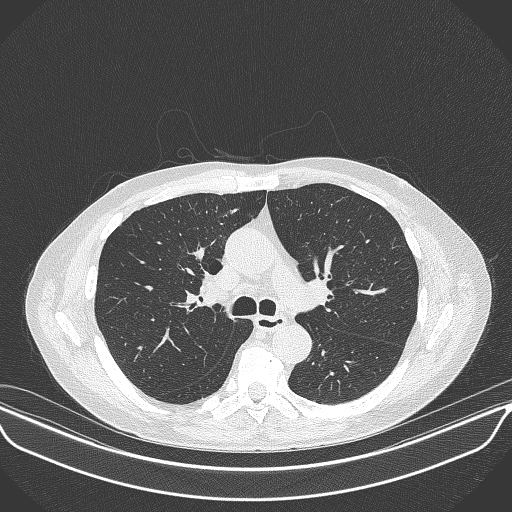
[im 221/325  lung]
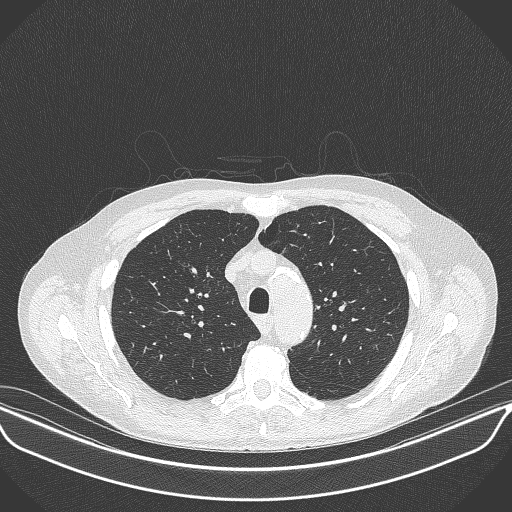
[im 251/325  lung]
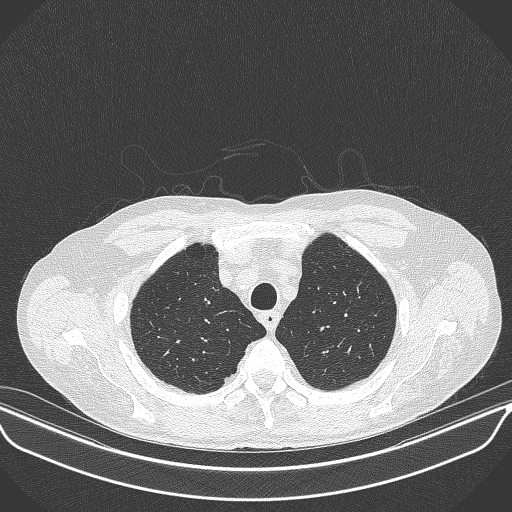
[im 295/325  lung]
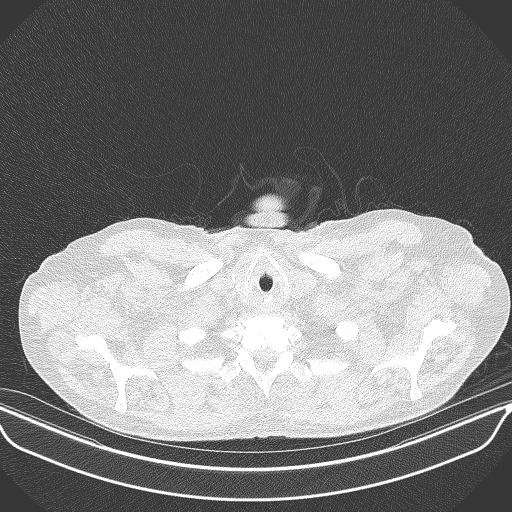

[12 of 32 positions shown; findings below may reference images not displayed]

FINDINGS: Cardiovascular: Heart size is normal. There is no significant
pericardial fluid, thickening or pericardial calcification. There is
aortic atherosclerosis, as well as atherosclerosis of the great
vessels of the mediastinum and the coronary arteries, including
calcified atherosclerotic plaque in the left main, left anterior
descending, left circumflex and right coronary arteries.

Mediastinum/Nodes: There is some soft tissue in the anterior
mediastinum which has been stable over numerous prior examinations
dating back to 2385, presumably benign. No other pathologically
enlarged mediastinal or hilar lymph nodes are noted. Please note
that accurate exclusion of hilar adenopathy is limited on
noncontrast CT scans. Esophagus is unremarkable in appearance. No
axillary lymphadenopathy.

Lungs/Pleura: Multiple small pulmonary nodules are again noted
throughout the lungs bilaterally, largest of which is in the
periphery of the left lower lobe near the base (axial image 290 of
series 3), with a volume derived mean diameter 4.6 mm. No larger
more suspicious appearing pulmonary nodules or masses are noted. No
acute consolidative airspace disease. No pleural effusions. Mild
diffuse bronchial wall thickening with mild centrilobular and
paraseptal emphysema.

Upper Abdomen: Aortic atherosclerosis. Low-attenuation lesion in the
interpolar region of the left kidney, incompletely characterized on
today's non-contrast CT examination, but statistically likely to
represent a cyst. Diffuse low attenuation throughout the visualized
hepatic parenchyma, indicative of severe hepatic steatosis.

Musculoskeletal: There are no aggressive appearing lytic or blastic
lesions noted in the visualized portions of the skeleton.
IMPRESSION: 1. Lung-RADS 2S, benign appearance or behavior. Continue annual
screening with low-dose chest CT without contrast in 12 months.
2. The "S" modifier above refers to potentially clinically
significant non lung cancer related findings. Specifically, there is
aortic atherosclerosis, in addition to left main and 3 vessel
coronary artery disease. Please note that although the presence of
coronary artery calcium documents the presence of coronary artery
disease, the severity of this disease and any potential stenosis
cannot be assessed on this non-gated CT examination. Assessment for
potential risk factor modification, dietary therapy or pharmacologic
therapy may be warranted, if clinically indicated.
3. Mild diffuse bronchial wall thickening with mild centrilobular
and paraseptal emphysema; imaging findings suggestive of underlying
COPD.
4. Hepatic steatosis.

Aortic Atherosclerosis ([id number].Q) and Emphysema ([id number]-CZF.1).

## 2023-06-25 ENCOUNTER — Ambulatory Visit: Payer: Medicare Other | Admitting: Oncology

## 2023-06-25 ENCOUNTER — Other Ambulatory Visit: Payer: Medicare Other

## 2023-06-25 ENCOUNTER — Encounter (INDEPENDENT_AMBULATORY_CARE_PROVIDER_SITE_OTHER): Payer: Medicare Other | Admitting: Ophthalmology

## 2023-06-25 DIAGNOSIS — I10 Essential (primary) hypertension: Secondary | ICD-10-CM

## 2023-06-25 DIAGNOSIS — H35711 Central serous chorioretinopathy, right eye: Secondary | ICD-10-CM

## 2023-06-25 DIAGNOSIS — H35033 Hypertensive retinopathy, bilateral: Secondary | ICD-10-CM | POA: Diagnosis not present

## 2023-06-25 DIAGNOSIS — H43813 Vitreous degeneration, bilateral: Secondary | ICD-10-CM

## 2023-06-25 DIAGNOSIS — H353211 Exudative age-related macular degeneration, right eye, with active choroidal neovascularization: Secondary | ICD-10-CM | POA: Diagnosis not present

## 2023-06-25 DIAGNOSIS — H353122 Nonexudative age-related macular degeneration, left eye, intermediate dry stage: Secondary | ICD-10-CM | POA: Diagnosis not present

## 2023-07-03 ENCOUNTER — Inpatient Hospital Stay: Payer: Medicare Other

## 2023-07-03 ENCOUNTER — Inpatient Hospital Stay: Payer: Medicare Other | Attending: Oncology | Admitting: Oncology

## 2023-07-03 ENCOUNTER — Other Ambulatory Visit: Payer: Medicare Other

## 2023-07-03 VITALS — BP 103/68 | HR 93 | Temp 97.9°F | Resp 18 | Ht 68.0 in | Wt 155.9 lb

## 2023-07-03 DIAGNOSIS — J449 Chronic obstructive pulmonary disease, unspecified: Secondary | ICD-10-CM | POA: Diagnosis not present

## 2023-07-03 DIAGNOSIS — I10 Essential (primary) hypertension: Secondary | ICD-10-CM | POA: Insufficient documentation

## 2023-07-03 DIAGNOSIS — N289 Disorder of kidney and ureter, unspecified: Secondary | ICD-10-CM | POA: Insufficient documentation

## 2023-07-03 DIAGNOSIS — Z860101 Personal history of adenomatous and serrated colon polyps: Secondary | ICD-10-CM | POA: Diagnosis not present

## 2023-07-03 DIAGNOSIS — R778 Other specified abnormalities of plasma proteins: Secondary | ICD-10-CM | POA: Insufficient documentation

## 2023-07-03 DIAGNOSIS — D649 Anemia, unspecified: Secondary | ICD-10-CM | POA: Insufficient documentation

## 2023-07-03 LAB — CBC WITH DIFFERENTIAL (CANCER CENTER ONLY)
Abs Immature Granulocytes: 0.02 10*3/uL (ref 0.00–0.07)
Basophils Absolute: 0 10*3/uL (ref 0.0–0.1)
Basophils Relative: 0 %
Eosinophils Absolute: 0.2 10*3/uL (ref 0.0–0.5)
Eosinophils Relative: 2 %
HCT: 32.8 % — ABNORMAL LOW (ref 39.0–52.0)
Hemoglobin: 10.7 g/dL — ABNORMAL LOW (ref 13.0–17.0)
Immature Granulocytes: 0 %
Lymphocytes Relative: 27 %
Lymphs Abs: 2.1 10*3/uL (ref 0.7–4.0)
MCH: 31.1 pg (ref 26.0–34.0)
MCHC: 32.6 g/dL (ref 30.0–36.0)
MCV: 95.3 fL (ref 80.0–100.0)
Monocytes Absolute: 0.8 10*3/uL (ref 0.1–1.0)
Monocytes Relative: 10 %
Neutro Abs: 4.8 10*3/uL (ref 1.7–7.7)
Neutrophils Relative %: 61 %
Platelet Count: 245 10*3/uL (ref 150–400)
RBC: 3.44 MIL/uL — ABNORMAL LOW (ref 4.22–5.81)
RDW: 13.2 % (ref 11.5–15.5)
WBC Count: 8 10*3/uL (ref 4.0–10.5)
nRBC: 0 % (ref 0.0–0.2)

## 2023-07-03 LAB — CMP (CANCER CENTER ONLY)
ALT: 17 U/L (ref 0–44)
AST: 19 U/L (ref 15–41)
Albumin: 4.4 g/dL (ref 3.5–5.0)
Alkaline Phosphatase: 74 U/L (ref 38–126)
Anion gap: 8 (ref 5–15)
BUN: 33 mg/dL — ABNORMAL HIGH (ref 8–23)
CO2: 30 mmol/L (ref 22–32)
Calcium: 9.2 mg/dL (ref 8.9–10.3)
Chloride: 99 mmol/L (ref 98–111)
Creatinine: 1.82 mg/dL — ABNORMAL HIGH (ref 0.61–1.24)
GFR, Estimated: 38 mL/min — ABNORMAL LOW (ref >60)
Glucose, Bld: 104 mg/dL — ABNORMAL HIGH (ref 70–99)
Potassium: 4.7 mmol/L (ref 3.5–5.1)
Sodium: 137 mmol/L (ref 135–145)
Total Bilirubin: 0.4 mg/dL (ref 0.0–1.2)
Total Protein: 7.5 g/dL (ref 6.5–8.1)

## 2023-07-03 NOTE — Progress Notes (Signed)
  [facility] OFFICE PROGRESS NOTE   Diagnosis: Serum monoclonal protein  INTERVAL HISTORY:   George Parrish returns as scheduled.  He feels well.  No recent infection.  No pain.  He has stable exertional dyspnea.  Objective:  Vital signs in last 24 hours:  Blood pressure 103/68, pulse 93, temperature 97.9 F (36.6 C), temperature source Temporal, resp. rate 18, height 5' 8 (1.727 m), weight 155 lb 14.4 oz (70.7 kg), SpO2 95%.     Lymphatics: No cervical, supraclavicular, axillary, or inguinal nodes Resp: Breath sounds, no respiratory distress Cardio: Regular rate and rhythm GI: Hepatosplenomegaly Vascular: No leg edema   Lab Results:  Lab Results  Component Value Date   WBC 8.0 07/03/2023   HGB 10.7 (L) 07/03/2023   HCT 32.8 (L) 07/03/2023   MCV 95.3 07/03/2023   PLT 245 07/03/2023   NEUTROABS 4.8 07/03/2023    CMP  Lab Results  Component Value Date   NA 132 (L) 03/09/2023   K 4.8 03/09/2023   CL 99 03/09/2023   CO2 25 03/09/2023   GLUCOSE 118 (H) 03/09/2023   BUN 35 (H) 03/09/2023   CREATININE 1.72 (H) 03/09/2023   CALCIUM 8.9 03/09/2023   PROT 7.4 02/23/2023   ALBUMIN 4.3 02/23/2023   AST 21 02/23/2023   ALT 21 02/23/2023   ALKPHOS 78 02/23/2023   BILITOT 0.4 02/23/2023   GFRNONAA 41 (L) 03/09/2023     Medications: I have reviewed the patient's current medications.   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   Renal insufficiency 4.  Hypertension 5.  COPD 6.  Bone marrow biopsy 01/20/2022-mild plasmacytosis, clonality not established 0.5 g serum M spike 12/07/2021 Serum immunofixation 12/07/2021-biclonal IgG lambda protein Normal immunoglobulin levels 12/07/2021 Mild elevation of kappa and lambda light chain 12/07/2021     Disposition: George Parrish appears stable.  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  The serum M spike was stable when he was here in August.  We will follow-up on the myeloma panel from today.  The hemoglobin is stable.  I encouraged him to obtain influenza and pneumonia vaccines.  He will return for an office and lab visit in 6 months.  Arley Hof, MD  07/03/2023  10:49 AM

## 2023-07-05 LAB — KAPPA/LAMBDA LIGHT CHAINS
Kappa free light chain: 48.3 mg/L — ABNORMAL HIGH (ref 3.3–19.4)
Kappa, lambda light chain ratio: 1.12 (ref 0.26–1.65)
Lambda free light chains: 43 mg/L — ABNORMAL HIGH (ref 5.7–26.3)

## 2023-07-08 LAB — PROTEIN ELECTROPHORESIS, SERUM
A/G Ratio: 1.3 (ref 0.7–1.7)
Albumin ELP: 4.1 g/dL (ref 2.9–4.4)
Alpha-1-Globulin: 0.2 g/dL (ref 0.0–0.4)
Alpha-2-Globulin: 0.8 g/dL (ref 0.4–1.0)
Beta Globulin: 0.8 g/dL (ref 0.7–1.3)
Gamma Globulin: 1.3 g/dL (ref 0.4–1.8)
Globulin, Total: 3.1 g/dL (ref 2.2–3.9)
M-Spike, %: 0.5 g/dL — ABNORMAL HIGH
Total Protein ELP: 7.2 g/dL (ref 6.0–8.5)

## 2023-07-09 ENCOUNTER — Telehealth: Payer: Self-pay | Admitting: *Deleted

## 2023-07-09 NOTE — Telephone Encounter (Signed)
 Notified George Parrish of stable Mspike. F/U as scheduled.

## 2023-07-09 NOTE — Telephone Encounter (Signed)
-----   Message from Thornton Papas sent at 07/09/2023  2:33 PM EST ----- Please call patient, the serum monoclonal protein is stable, follow-up as scheduled

## 2023-07-16 DIAGNOSIS — N289 Disorder of kidney and ureter, unspecified: Secondary | ICD-10-CM | POA: Diagnosis not present

## 2023-07-17 ENCOUNTER — Other Ambulatory Visit: Payer: Self-pay | Admitting: *Deleted

## 2023-07-17 DIAGNOSIS — Z122 Encounter for screening for malignant neoplasm of respiratory organs: Secondary | ICD-10-CM

## 2023-07-17 DIAGNOSIS — Z87891 Personal history of nicotine dependence: Secondary | ICD-10-CM

## 2023-07-17 DIAGNOSIS — F17210 Nicotine dependence, cigarettes, uncomplicated: Secondary | ICD-10-CM

## 2023-08-01 DIAGNOSIS — N183 Chronic kidney disease, stage 3 unspecified: Secondary | ICD-10-CM | POA: Diagnosis not present

## 2023-08-02 DIAGNOSIS — Z860101 Personal history of adenomatous and serrated colon polyps: Secondary | ICD-10-CM | POA: Diagnosis not present

## 2023-08-02 DIAGNOSIS — K635 Polyp of colon: Secondary | ICD-10-CM | POA: Diagnosis not present

## 2023-08-02 DIAGNOSIS — Z09 Encounter for follow-up examination after completed treatment for conditions other than malignant neoplasm: Secondary | ICD-10-CM | POA: Diagnosis not present

## 2023-08-02 DIAGNOSIS — Z9889 Other specified postprocedural states: Secondary | ICD-10-CM | POA: Diagnosis not present

## 2023-08-02 DIAGNOSIS — K5730 Diverticulosis of large intestine without perforation or abscess without bleeding: Secondary | ICD-10-CM | POA: Diagnosis not present

## 2023-08-02 DIAGNOSIS — D123 Benign neoplasm of transverse colon: Secondary | ICD-10-CM | POA: Diagnosis not present

## 2023-08-06 ENCOUNTER — Encounter (INDEPENDENT_AMBULATORY_CARE_PROVIDER_SITE_OTHER): Payer: Medicare Other | Admitting: Ophthalmology

## 2023-08-06 DIAGNOSIS — E113293 Type 2 diabetes mellitus with mild nonproliferative diabetic retinopathy without macular edema, bilateral: Secondary | ICD-10-CM

## 2023-08-06 DIAGNOSIS — H353121 Nonexudative age-related macular degeneration, left eye, early dry stage: Secondary | ICD-10-CM | POA: Diagnosis not present

## 2023-08-06 DIAGNOSIS — I10 Essential (primary) hypertension: Secondary | ICD-10-CM

## 2023-08-06 DIAGNOSIS — H353211 Exudative age-related macular degeneration, right eye, with active choroidal neovascularization: Secondary | ICD-10-CM

## 2023-08-06 DIAGNOSIS — K635 Polyp of colon: Secondary | ICD-10-CM | POA: Diagnosis not present

## 2023-08-06 DIAGNOSIS — H43813 Vitreous degeneration, bilateral: Secondary | ICD-10-CM

## 2023-08-06 DIAGNOSIS — Z7984 Long term (current) use of oral hypoglycemic drugs: Secondary | ICD-10-CM

## 2023-08-06 DIAGNOSIS — D123 Benign neoplasm of transverse colon: Secondary | ICD-10-CM | POA: Diagnosis not present

## 2023-08-06 DIAGNOSIS — H35033 Hypertensive retinopathy, bilateral: Secondary | ICD-10-CM | POA: Diagnosis not present

## 2023-08-06 DIAGNOSIS — H2512 Age-related nuclear cataract, left eye: Secondary | ICD-10-CM

## 2023-08-16 DIAGNOSIS — Z23 Encounter for immunization: Secondary | ICD-10-CM | POA: Diagnosis not present

## 2023-08-16 DIAGNOSIS — I10 Essential (primary) hypertension: Secondary | ICD-10-CM | POA: Diagnosis not present

## 2023-08-16 DIAGNOSIS — I2510 Atherosclerotic heart disease of native coronary artery without angina pectoris: Secondary | ICD-10-CM | POA: Diagnosis not present

## 2023-08-16 DIAGNOSIS — H3530 Unspecified macular degeneration: Secondary | ICD-10-CM | POA: Diagnosis not present

## 2023-08-16 DIAGNOSIS — J449 Chronic obstructive pulmonary disease, unspecified: Secondary | ICD-10-CM | POA: Diagnosis not present

## 2023-08-16 DIAGNOSIS — F17210 Nicotine dependence, cigarettes, uncomplicated: Secondary | ICD-10-CM | POA: Diagnosis not present

## 2023-08-16 DIAGNOSIS — D472 Monoclonal gammopathy: Secondary | ICD-10-CM | POA: Diagnosis not present

## 2023-08-16 DIAGNOSIS — N183 Chronic kidney disease, stage 3 unspecified: Secondary | ICD-10-CM | POA: Diagnosis not present

## 2023-09-17 ENCOUNTER — Encounter (INDEPENDENT_AMBULATORY_CARE_PROVIDER_SITE_OTHER): Payer: Medicare Other | Admitting: Ophthalmology

## 2023-09-17 DIAGNOSIS — E113293 Type 2 diabetes mellitus with mild nonproliferative diabetic retinopathy without macular edema, bilateral: Secondary | ICD-10-CM | POA: Diagnosis not present

## 2023-09-17 DIAGNOSIS — I10 Essential (primary) hypertension: Secondary | ICD-10-CM

## 2023-09-17 DIAGNOSIS — Z7984 Long term (current) use of oral hypoglycemic drugs: Secondary | ICD-10-CM | POA: Diagnosis not present

## 2023-09-17 DIAGNOSIS — H353121 Nonexudative age-related macular degeneration, left eye, early dry stage: Secondary | ICD-10-CM | POA: Diagnosis not present

## 2023-09-17 DIAGNOSIS — H353211 Exudative age-related macular degeneration, right eye, with active choroidal neovascularization: Secondary | ICD-10-CM

## 2023-09-17 DIAGNOSIS — H43813 Vitreous degeneration, bilateral: Secondary | ICD-10-CM

## 2023-09-17 DIAGNOSIS — H35033 Hypertensive retinopathy, bilateral: Secondary | ICD-10-CM

## 2023-10-29 ENCOUNTER — Encounter (INDEPENDENT_AMBULATORY_CARE_PROVIDER_SITE_OTHER): Admitting: Ophthalmology

## 2023-10-29 DIAGNOSIS — H353211 Exudative age-related macular degeneration, right eye, with active choroidal neovascularization: Secondary | ICD-10-CM | POA: Diagnosis not present

## 2023-10-29 DIAGNOSIS — Z7984 Long term (current) use of oral hypoglycemic drugs: Secondary | ICD-10-CM | POA: Diagnosis not present

## 2023-10-29 DIAGNOSIS — E113393 Type 2 diabetes mellitus with moderate nonproliferative diabetic retinopathy without macular edema, bilateral: Secondary | ICD-10-CM

## 2023-10-29 DIAGNOSIS — H35033 Hypertensive retinopathy, bilateral: Secondary | ICD-10-CM | POA: Diagnosis not present

## 2023-10-29 DIAGNOSIS — H43813 Vitreous degeneration, bilateral: Secondary | ICD-10-CM

## 2023-10-29 DIAGNOSIS — H353121 Nonexudative age-related macular degeneration, left eye, early dry stage: Secondary | ICD-10-CM

## 2023-10-29 DIAGNOSIS — H35711 Central serous chorioretinopathy, right eye: Secondary | ICD-10-CM | POA: Diagnosis not present

## 2023-10-29 DIAGNOSIS — I10 Essential (primary) hypertension: Secondary | ICD-10-CM

## 2023-12-10 ENCOUNTER — Encounter (INDEPENDENT_AMBULATORY_CARE_PROVIDER_SITE_OTHER): Admitting: Ophthalmology

## 2023-12-10 DIAGNOSIS — H353121 Nonexudative age-related macular degeneration, left eye, early dry stage: Secondary | ICD-10-CM

## 2023-12-10 DIAGNOSIS — E113393 Type 2 diabetes mellitus with moderate nonproliferative diabetic retinopathy without macular edema, bilateral: Secondary | ICD-10-CM

## 2023-12-10 DIAGNOSIS — H353211 Exudative age-related macular degeneration, right eye, with active choroidal neovascularization: Secondary | ICD-10-CM | POA: Diagnosis not present

## 2023-12-10 DIAGNOSIS — H35711 Central serous chorioretinopathy, right eye: Secondary | ICD-10-CM | POA: Diagnosis not present

## 2023-12-10 DIAGNOSIS — Z7984 Long term (current) use of oral hypoglycemic drugs: Secondary | ICD-10-CM

## 2023-12-10 DIAGNOSIS — H35033 Hypertensive retinopathy, bilateral: Secondary | ICD-10-CM

## 2023-12-10 DIAGNOSIS — I10 Essential (primary) hypertension: Secondary | ICD-10-CM

## 2023-12-10 DIAGNOSIS — H43813 Vitreous degeneration, bilateral: Secondary | ICD-10-CM | POA: Diagnosis not present

## 2023-12-14 DIAGNOSIS — N183 Chronic kidney disease, stage 3 unspecified: Secondary | ICD-10-CM | POA: Diagnosis not present

## 2023-12-14 DIAGNOSIS — H3530 Unspecified macular degeneration: Secondary | ICD-10-CM | POA: Diagnosis not present

## 2023-12-14 DIAGNOSIS — I2510 Atherosclerotic heart disease of native coronary artery without angina pectoris: Secondary | ICD-10-CM | POA: Diagnosis not present

## 2023-12-14 DIAGNOSIS — J449 Chronic obstructive pulmonary disease, unspecified: Secondary | ICD-10-CM | POA: Diagnosis not present

## 2023-12-14 DIAGNOSIS — D472 Monoclonal gammopathy: Secondary | ICD-10-CM | POA: Diagnosis not present

## 2023-12-14 DIAGNOSIS — I10 Essential (primary) hypertension: Secondary | ICD-10-CM | POA: Diagnosis not present

## 2023-12-14 DIAGNOSIS — F17210 Nicotine dependence, cigarettes, uncomplicated: Secondary | ICD-10-CM | POA: Diagnosis not present

## 2023-12-31 ENCOUNTER — Inpatient Hospital Stay: Payer: Medicare Other | Attending: Oncology

## 2023-12-31 ENCOUNTER — Encounter: Payer: Self-pay | Admitting: Oncology

## 2023-12-31 ENCOUNTER — Inpatient Hospital Stay (HOSPITAL_BASED_OUTPATIENT_CLINIC_OR_DEPARTMENT_OTHER): Payer: Medicare Other | Admitting: Oncology

## 2023-12-31 VITALS — BP 133/87 | HR 90 | Temp 98.1°F | Resp 18 | Ht 68.0 in | Wt 153.1 lb

## 2023-12-31 DIAGNOSIS — N183 Chronic kidney disease, stage 3 unspecified: Secondary | ICD-10-CM | POA: Diagnosis not present

## 2023-12-31 DIAGNOSIS — E785 Hyperlipidemia, unspecified: Secondary | ICD-10-CM | POA: Diagnosis not present

## 2023-12-31 DIAGNOSIS — M19032 Primary osteoarthritis, left wrist: Secondary | ICD-10-CM | POA: Diagnosis not present

## 2023-12-31 DIAGNOSIS — D649 Anemia, unspecified: Secondary | ICD-10-CM

## 2023-12-31 DIAGNOSIS — R778 Other specified abnormalities of plasma proteins: Secondary | ICD-10-CM | POA: Diagnosis not present

## 2023-12-31 DIAGNOSIS — R195 Other fecal abnormalities: Secondary | ICD-10-CM | POA: Insufficient documentation

## 2023-12-31 DIAGNOSIS — I2510 Atherosclerotic heart disease of native coronary artery without angina pectoris: Secondary | ICD-10-CM | POA: Diagnosis not present

## 2023-12-31 LAB — CBC WITH DIFFERENTIAL (CANCER CENTER ONLY)
Abs Immature Granulocytes: 0.02 10*3/uL (ref 0.00–0.07)
Basophils Absolute: 0 10*3/uL (ref 0.0–0.1)
Basophils Relative: 0 %
Eosinophils Absolute: 0.1 10*3/uL (ref 0.0–0.5)
Eosinophils Relative: 1 %
HCT: 34 % — ABNORMAL LOW (ref 39.0–52.0)
Hemoglobin: 11.2 g/dL — ABNORMAL LOW (ref 13.0–17.0)
Immature Granulocytes: 0 %
Lymphocytes Relative: 22 %
Lymphs Abs: 1.6 10*3/uL (ref 0.7–4.0)
MCH: 30.9 pg (ref 26.0–34.0)
MCHC: 32.9 g/dL (ref 30.0–36.0)
MCV: 93.7 fL (ref 80.0–100.0)
Monocytes Absolute: 0.9 10*3/uL (ref 0.1–1.0)
Monocytes Relative: 12 %
Neutro Abs: 4.9 10*3/uL (ref 1.7–7.7)
Neutrophils Relative %: 65 %
Platelet Count: 217 10*3/uL (ref 150–400)
RBC: 3.63 MIL/uL — ABNORMAL LOW (ref 4.22–5.81)
RDW: 14.5 % (ref 11.5–15.5)
WBC Count: 7.5 10*3/uL (ref 4.0–10.5)
nRBC: 0 % (ref 0.0–0.2)

## 2023-12-31 LAB — CMP (CANCER CENTER ONLY)
ALT: 57 U/L — ABNORMAL HIGH (ref 0–44)
AST: 52 U/L — ABNORMAL HIGH (ref 15–41)
Albumin: 4.2 g/dL (ref 3.5–5.0)
Alkaline Phosphatase: 94 U/L (ref 38–126)
Anion gap: 11 (ref 5–15)
BUN: 25 mg/dL — ABNORMAL HIGH (ref 8–23)
CO2: 31 mmol/L (ref 22–32)
Calcium: 9.6 mg/dL (ref 8.9–10.3)
Chloride: 97 mmol/L — ABNORMAL LOW (ref 98–111)
Creatinine: 1.67 mg/dL — ABNORMAL HIGH (ref 0.61–1.24)
GFR, Estimated: 42 mL/min — ABNORMAL LOW (ref >60)
Glucose, Bld: 95 mg/dL (ref 70–99)
Potassium: 3.7 mmol/L (ref 3.5–5.1)
Sodium: 139 mmol/L (ref 135–145)
Total Bilirubin: 0.4 mg/dL (ref 0.0–1.2)
Total Protein: 7.7 g/dL (ref 6.5–8.1)

## 2023-12-31 NOTE — Progress Notes (Signed)
  [facility] OFFICE PROGRESS NOTE   Diagnosis: Serum monoclonal protein  INTERVAL HISTORY:   George Parrish returns as scheduled.  He feels well.  No fever or night sweats.  No recent infection.  He reports improvement in dyspnea with the Breztri inhaler.  He denies heavy alcohol use.  He reports drinking a sixpack of beer lasts for 2 weeks.  Objective:  Vital signs in last 24 hours:  Blood pressure 133/87, pulse 90, temperature 98.1 F (36.7 C), temperature source Temporal, resp. rate 18, height 5' 8 (1.727 m), weight 153 lb 1.6 oz (69.4 kg), SpO2 98%.  Lymphatics: No cervical, supraclavicular, axillary, or inguinal nodes Resp: Lungs clear bilaterally Cardio: Regular rate and rhythm GI: No mass, no hepatosplenomegaly Vascular: No leg edema   Lab Results:  Lab Results  Component Value Date   WBC 7.5 12/31/2023   HGB 11.2 (L) 12/31/2023   HCT 34.0 (L) 12/31/2023   MCV 93.7 12/31/2023   PLT 217 12/31/2023   NEUTROABS 4.9 12/31/2023    CMP  Lab Results  Component Value Date   NA 137 07/03/2023   K 4.7 07/03/2023   CL 99 07/03/2023   CO2 30 07/03/2023   GLUCOSE 104 (H) 07/03/2023   BUN 33 (H) 07/03/2023   CREATININE 1.82 (H) 07/03/2023   CALCIUM 9.2 07/03/2023   PROT 7.5 07/03/2023   ALBUMIN 4.4 07/03/2023   AST 19 07/03/2023   ALT 17 07/03/2023   ALKPHOS 74 07/03/2023   BILITOT 0.4 07/03/2023   GFRNONAA 38 (L) 07/03/2023     Medications: I have reviewed the patient's current medications.   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  Renal insufficiency 4.  Hypertension 5.  COPD 6.  Bone marrow biopsy 01/20/2022-mild plasmacytosis, clonality not established 0.5 g serum M spike 12/07/2021 Serum immunofixation 12/07/2021-biclonal IgG lambda protein Normal immunoglobulin levels 12/07/2021 Mild elevation of kappa and lambda light chain 12/07/2021      Disposition: [name] appears stable.  He has a serum monoclonal protein.  The serum M spike was stable when he was here in December 2024.  There is no clinical evidence for progression to multiple myeloma or another lymphoproliferative disorder.  He will return for an office and lab visit in 8 months.  He will remain up-to-date on influenza and pneumonia vaccines. The liver enzymes are mildly elevated today.  This could be related to rosuvastatin.  We will forward the chemistry panel results to Dr. Charlott.  Arley Hof, MD  12/31/2023  2:55 PM

## 2024-01-01 LAB — KAPPA/LAMBDA LIGHT CHAINS
Kappa free light chain: 43.6 mg/L — ABNORMAL HIGH (ref 3.3–19.4)
Kappa, lambda light chain ratio: 0.94 (ref 0.26–1.65)
Lambda free light chains: 46.2 mg/L — ABNORMAL HIGH (ref 5.7–26.3)

## 2024-01-02 LAB — PROTEIN ELECTROPHORESIS, SERUM
A/G Ratio: 1.3 (ref 0.7–1.7)
Albumin ELP: 3.9 g/dL (ref 2.9–4.4)
Alpha-1-Globulin: 0.1 g/dL (ref 0.0–0.4)
Alpha-2-Globulin: 0.7 g/dL (ref 0.4–1.0)
Beta Globulin: 0.8 g/dL (ref 0.7–1.3)
Gamma Globulin: 1.3 g/dL (ref 0.4–1.8)
Globulin, Total: 3 g/dL (ref 2.2–3.9)
M-Spike, %: 0.4 g/dL — ABNORMAL HIGH
Total Protein ELP: 6.9 g/dL (ref 6.0–8.5)

## 2024-01-21 ENCOUNTER — Encounter (INDEPENDENT_AMBULATORY_CARE_PROVIDER_SITE_OTHER): Admitting: Ophthalmology

## 2024-01-21 DIAGNOSIS — Z7984 Long term (current) use of oral hypoglycemic drugs: Secondary | ICD-10-CM | POA: Diagnosis not present

## 2024-01-21 DIAGNOSIS — H2512 Age-related nuclear cataract, left eye: Secondary | ICD-10-CM

## 2024-01-21 DIAGNOSIS — H43813 Vitreous degeneration, bilateral: Secondary | ICD-10-CM

## 2024-01-21 DIAGNOSIS — H353121 Nonexudative age-related macular degeneration, left eye, early dry stage: Secondary | ICD-10-CM | POA: Diagnosis not present

## 2024-01-21 DIAGNOSIS — E113393 Type 2 diabetes mellitus with moderate nonproliferative diabetic retinopathy without macular edema, bilateral: Secondary | ICD-10-CM

## 2024-01-21 DIAGNOSIS — H353211 Exudative age-related macular degeneration, right eye, with active choroidal neovascularization: Secondary | ICD-10-CM | POA: Diagnosis not present

## 2024-01-21 DIAGNOSIS — I10 Essential (primary) hypertension: Secondary | ICD-10-CM | POA: Diagnosis not present

## 2024-01-21 DIAGNOSIS — H35033 Hypertensive retinopathy, bilateral: Secondary | ICD-10-CM

## 2024-01-31 DIAGNOSIS — E785 Hyperlipidemia, unspecified: Secondary | ICD-10-CM | POA: Diagnosis not present

## 2024-01-31 DIAGNOSIS — M19032 Primary osteoarthritis, left wrist: Secondary | ICD-10-CM | POA: Diagnosis not present

## 2024-01-31 DIAGNOSIS — N183 Chronic kidney disease, stage 3 unspecified: Secondary | ICD-10-CM | POA: Diagnosis not present

## 2024-01-31 DIAGNOSIS — I2510 Atherosclerotic heart disease of native coronary artery without angina pectoris: Secondary | ICD-10-CM | POA: Diagnosis not present

## 2024-03-02 DIAGNOSIS — E785 Hyperlipidemia, unspecified: Secondary | ICD-10-CM | POA: Diagnosis not present

## 2024-03-02 DIAGNOSIS — M19032 Primary osteoarthritis, left wrist: Secondary | ICD-10-CM | POA: Diagnosis not present

## 2024-03-02 DIAGNOSIS — I2510 Atherosclerotic heart disease of native coronary artery without angina pectoris: Secondary | ICD-10-CM | POA: Diagnosis not present

## 2024-03-02 DIAGNOSIS — N183 Chronic kidney disease, stage 3 unspecified: Secondary | ICD-10-CM | POA: Diagnosis not present

## 2024-03-04 ENCOUNTER — Encounter (INDEPENDENT_AMBULATORY_CARE_PROVIDER_SITE_OTHER): Admitting: Ophthalmology

## 2024-03-04 DIAGNOSIS — Z7984 Long term (current) use of oral hypoglycemic drugs: Secondary | ICD-10-CM | POA: Diagnosis not present

## 2024-03-04 DIAGNOSIS — E113293 Type 2 diabetes mellitus with mild nonproliferative diabetic retinopathy without macular edema, bilateral: Secondary | ICD-10-CM

## 2024-03-04 DIAGNOSIS — H353121 Nonexudative age-related macular degeneration, left eye, early dry stage: Secondary | ICD-10-CM | POA: Diagnosis not present

## 2024-03-04 DIAGNOSIS — H35033 Hypertensive retinopathy, bilateral: Secondary | ICD-10-CM

## 2024-03-04 DIAGNOSIS — I10 Essential (primary) hypertension: Secondary | ICD-10-CM | POA: Diagnosis not present

## 2024-03-04 DIAGNOSIS — H43813 Vitreous degeneration, bilateral: Secondary | ICD-10-CM | POA: Diagnosis not present

## 2024-03-04 DIAGNOSIS — H353211 Exudative age-related macular degeneration, right eye, with active choroidal neovascularization: Secondary | ICD-10-CM | POA: Diagnosis not present

## 2024-03-04 DIAGNOSIS — H2512 Age-related nuclear cataract, left eye: Secondary | ICD-10-CM | POA: Diagnosis not present

## 2024-03-27 ENCOUNTER — Telehealth: Payer: Self-pay

## 2024-03-27 NOTE — Telephone Encounter (Signed)
 The patient's appointment has been rescheduled to Aug 28, 2024, to accommodate the provider's updated office hours. A letter confirming the new appointment date has been mailed to the patient.

## 2024-04-01 DIAGNOSIS — N183 Chronic kidney disease, stage 3 unspecified: Secondary | ICD-10-CM | POA: Diagnosis not present

## 2024-04-01 DIAGNOSIS — E785 Hyperlipidemia, unspecified: Secondary | ICD-10-CM | POA: Diagnosis not present

## 2024-04-01 DIAGNOSIS — M19032 Primary osteoarthritis, left wrist: Secondary | ICD-10-CM | POA: Diagnosis not present

## 2024-04-01 DIAGNOSIS — I2510 Atherosclerotic heart disease of native coronary artery without angina pectoris: Secondary | ICD-10-CM | POA: Diagnosis not present

## 2024-04-14 ENCOUNTER — Encounter (INDEPENDENT_AMBULATORY_CARE_PROVIDER_SITE_OTHER): Admitting: Ophthalmology

## 2024-04-14 DIAGNOSIS — Z7984 Long term (current) use of oral hypoglycemic drugs: Secondary | ICD-10-CM | POA: Diagnosis not present

## 2024-04-14 DIAGNOSIS — E113292 Type 2 diabetes mellitus with mild nonproliferative diabetic retinopathy without macular edema, left eye: Secondary | ICD-10-CM | POA: Diagnosis not present

## 2024-04-14 DIAGNOSIS — I10 Essential (primary) hypertension: Secondary | ICD-10-CM

## 2024-04-14 DIAGNOSIS — H35033 Hypertensive retinopathy, bilateral: Secondary | ICD-10-CM

## 2024-04-14 DIAGNOSIS — H2512 Age-related nuclear cataract, left eye: Secondary | ICD-10-CM

## 2024-04-14 DIAGNOSIS — H353121 Nonexudative age-related macular degeneration, left eye, early dry stage: Secondary | ICD-10-CM | POA: Diagnosis not present

## 2024-04-14 DIAGNOSIS — H43813 Vitreous degeneration, bilateral: Secondary | ICD-10-CM | POA: Diagnosis not present

## 2024-04-14 DIAGNOSIS — E113391 Type 2 diabetes mellitus with moderate nonproliferative diabetic retinopathy without macular edema, right eye: Secondary | ICD-10-CM | POA: Diagnosis not present

## 2024-04-14 DIAGNOSIS — H353211 Exudative age-related macular degeneration, right eye, with active choroidal neovascularization: Secondary | ICD-10-CM | POA: Diagnosis not present

## 2024-04-22 ENCOUNTER — Encounter: Payer: Self-pay | Admitting: Acute Care

## 2024-05-20 NOTE — Progress Notes (Signed)
 George Parrish                                          MRN: [id number]   05/20/2024   The [facility] Quality Team Specialist reviewed this patient medical record for the purposes of chart review for care gap closure. The following were reviewed: chart review for care gap closure-kidney health evaluation for diabetes:eGFR  and uACR.    [facility] Quality Team

## 2024-06-02 ENCOUNTER — Encounter (INDEPENDENT_AMBULATORY_CARE_PROVIDER_SITE_OTHER): Admitting: Ophthalmology

## 2024-06-02 DIAGNOSIS — Z7984 Long term (current) use of oral hypoglycemic drugs: Secondary | ICD-10-CM | POA: Diagnosis not present

## 2024-06-02 DIAGNOSIS — E113293 Type 2 diabetes mellitus with mild nonproliferative diabetic retinopathy without macular edema, bilateral: Secondary | ICD-10-CM

## 2024-06-02 DIAGNOSIS — H353121 Nonexudative age-related macular degeneration, left eye, early dry stage: Secondary | ICD-10-CM | POA: Diagnosis not present

## 2024-06-02 DIAGNOSIS — I10 Essential (primary) hypertension: Secondary | ICD-10-CM

## 2024-06-02 DIAGNOSIS — H353211 Exudative age-related macular degeneration, right eye, with active choroidal neovascularization: Secondary | ICD-10-CM | POA: Diagnosis not present

## 2024-06-02 DIAGNOSIS — H35033 Hypertensive retinopathy, bilateral: Secondary | ICD-10-CM

## 2024-06-02 DIAGNOSIS — H43813 Vitreous degeneration, bilateral: Secondary | ICD-10-CM

## 2024-06-05 ENCOUNTER — Other Ambulatory Visit: Payer: Self-pay | Admitting: Internal Medicine

## 2024-06-05 DIAGNOSIS — Z122 Encounter for screening for malignant neoplasm of respiratory organs: Secondary | ICD-10-CM

## 2024-06-16 ENCOUNTER — Inpatient Hospital Stay: Admission: RE | Admit: 2024-06-16 | Discharge: 2024-06-16 | Attending: Internal Medicine | Admitting: Internal Medicine

## 2024-06-16 DIAGNOSIS — Z122 Encounter for screening for malignant neoplasm of respiratory organs: Secondary | ICD-10-CM

## 2024-07-11 ENCOUNTER — Encounter (INDEPENDENT_AMBULATORY_CARE_PROVIDER_SITE_OTHER): Admitting: Ophthalmology

## 2024-07-11 DIAGNOSIS — I10 Essential (primary) hypertension: Secondary | ICD-10-CM | POA: Diagnosis not present

## 2024-07-11 DIAGNOSIS — E113293 Type 2 diabetes mellitus with mild nonproliferative diabetic retinopathy without macular edema, bilateral: Secondary | ICD-10-CM

## 2024-07-11 DIAGNOSIS — H43813 Vitreous degeneration, bilateral: Secondary | ICD-10-CM | POA: Diagnosis not present

## 2024-07-11 DIAGNOSIS — Z7984 Long term (current) use of oral hypoglycemic drugs: Secondary | ICD-10-CM | POA: Diagnosis not present

## 2024-07-11 DIAGNOSIS — H35033 Hypertensive retinopathy, bilateral: Secondary | ICD-10-CM

## 2024-07-11 DIAGNOSIS — H353211 Exudative age-related macular degeneration, right eye, with active choroidal neovascularization: Secondary | ICD-10-CM

## 2024-07-11 DIAGNOSIS — H353121 Nonexudative age-related macular degeneration, left eye, early dry stage: Secondary | ICD-10-CM

## 2024-08-04 ENCOUNTER — Other Ambulatory Visit: Payer: Self-pay

## 2024-08-04 DIAGNOSIS — Z87891 Personal history of nicotine dependence: Secondary | ICD-10-CM

## 2024-08-04 DIAGNOSIS — Z122 Encounter for screening for malignant neoplasm of respiratory organs: Secondary | ICD-10-CM

## 2024-08-15 ENCOUNTER — Encounter (INDEPENDENT_AMBULATORY_CARE_PROVIDER_SITE_OTHER): Admitting: Ophthalmology

## 2024-08-28 ENCOUNTER — Ambulatory Visit: Admitting: Oncology

## 2024-08-28 ENCOUNTER — Other Ambulatory Visit

## 2024-08-29 ENCOUNTER — Other Ambulatory Visit

## 2024-08-29 ENCOUNTER — Ambulatory Visit: Admitting: Oncology
